# Patient Record
Sex: Male | Born: 1945
Health system: Southern US, Community
[De-identification: ages and names within clinical notes are randomized; demographics above are authoritative.]

## PROBLEM LIST (undated history)

## (undated) DIAGNOSIS — N189 Chronic kidney disease, unspecified: Secondary | ICD-10-CM

## (undated) DIAGNOSIS — I10 Essential (primary) hypertension: Secondary | ICD-10-CM

## (undated) DIAGNOSIS — N400 Enlarged prostate without lower urinary tract symptoms: Secondary | ICD-10-CM

## (undated) DIAGNOSIS — H409 Unspecified glaucoma: Secondary | ICD-10-CM

## (undated) DIAGNOSIS — C61 Malignant neoplasm of prostate: Secondary | ICD-10-CM

## (undated) DIAGNOSIS — E7849 Other hyperlipidemia: Secondary | ICD-10-CM

## (undated) DIAGNOSIS — K648 Other hemorrhoids: Secondary | ICD-10-CM

## (undated) HISTORY — PX: PROSTATE BIOPSY: SHX241

## (undated) HISTORY — DX: Unspecified glaucoma: H40.9

## (undated) HISTORY — DX: Essential (primary) hypertension: I10

## (undated) HISTORY — DX: Chronic kidney disease, unspecified: N18.9

## (undated) HISTORY — DX: Other hemorrhoids: K64.8

## (undated) HISTORY — DX: Other hyperlipidemia: E78.49

---
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
## 1999-06-04 ENCOUNTER — Ambulatory Visit (HOSPITAL_COMMUNITY): Admission: RE | Admit: 1999-06-04 | Discharge: 1999-06-04 | Payer: Self-pay | Admitting: *Deleted

## 2008-10-19 HISTORY — PX: EYE SURGERY: SHX253

## 2009-10-30 ENCOUNTER — Encounter (INDEPENDENT_AMBULATORY_CARE_PROVIDER_SITE_OTHER): Payer: Self-pay | Admitting: *Deleted

## 2009-10-31 ENCOUNTER — Ambulatory Visit: Payer: Self-pay | Admitting: Gastroenterology

## 2009-11-12 ENCOUNTER — Ambulatory Visit: Payer: Self-pay | Admitting: Gastroenterology

## 2010-11-20 NOTE — Miscellaneous (Signed)
Summary: SCREENING COLON--CH  Clinical Lists Changes  Medications: Added new medication of MOVIPREP 100 GM SOLR (PEG-KCL-NACL-NASULF-NA ASC-C) As directed - Signed Rx of MOVIPREP 100 GM SOLR (PEG-KCL-NACL-NASULF-NA ASC-C) As directed;  #1 x 0;  Signed;  Entered by: Clide Cliff RN;  Authorized by: Louis Meckel MD;  Method used: Electronically to [facility] [phone]*, [address], [place], [place]  [address], Ph: [phone], Fax: [phone] Observations: Added new observation of ALLERGY REV: Done (10/31/2009 15:55)    Prescriptions: MOVIPREP 100 GM SOLR (PEG-KCL-NACL-NASULF-NA ASC-C) As directed  #1 x 0   Entered by:   Clide Cliff RN   Authorized by:   Louis Meckel MD   Signed by:   Clide Cliff RN on 10/31/2009   Method used:   Electronically to        [facility] [phone]* (retail)       [address]       [place], [place]  [address]       Ph: [phone]       Fax: [phone]   RxID:   [id number]

## 2010-11-20 NOTE — Procedures (Signed)
Summary: Colonoscopy  Patient: Mitchael Luckey Note: All result statuses are Final unless otherwise noted.  Tests: (1) Colonoscopy (COL)   COL Colonoscopy           DONE     [facility]     [address]     [place], [place]  [address]           COLONOSCOPY PROCEDURE REPORT           PATIENT:  Binnie, Vonderhaar  MR#:  [id number]     BIRTHDATE:  05/26/1941, 68 yrs. old  GENDER:  male           ENDOSCOPIST:  Barbette Hair. Arlyce Dice, MD     Referred by:           PROCEDURE DATE:  11/12/2009     PROCEDURE:  Colonoscopy, Diagnostic     ASA CLASS:  Class I     INDICATIONS:  history of pre-cancerous (adenomatous) colon polyps                 MEDICATIONS:   Fentanyl 75 mcg IV, Versed 8 mg IV           DESCRIPTION OF PROCEDURE:   After the risks benefits and     alternatives of the procedure were thoroughly explained, informed     consent was obtained.  Digital rectal exam was performed and     revealed no abnormalities.   The LB CF-[id number] endoscope     was introduced through the anus and advanced to the cecum, which     was identified by both the appendix and ileocecal valve, without     limitations.  The quality of the prep was excellent, using     MoviPrep.  The instrument was then slowly withdrawn as the colon     was fully examined.     <<PROCEDUREIMAGES>>           FINDINGS:  Mild diverticulosis was found sigmoid to descending     Internal hemorrhoids were found (see image16).  This was otherwise     a normal examination of the colon (see image1, image3, image4,     image5, image6, image7, image11, image15, and image16).     Retroflexed views in the rectum revealed no abnormalities.    The     scope was then withdrawn from the patient and the procedure     completed.           COMPLICATIONS:  None           ENDOSCOPIC IMPRESSION:     1) Mild diverticulosis in the sigmoid to descending     2) Internal hemorrhoids     3) Otherwise normal examination  RECOMMENDATIONS:     1) colonoscopy in 7 years           REPEAT EXAM:  In 7 year(s) for Colonoscopy.           ______________________________     Barbette Hair. Arlyce Dice, MD           CC:  Juline Patch, MD           n.     Rosalie Doctor:   Barbette Hair. Kaplan at 11/12/2009 10:36 AM           Larose Hires, [id number]  Note: An exclamation mark (!) indicates a result that was not dispersed into the flowsheet. Document Creation Date: 11/12/2009 10:36 AM _______________________________________________________________________  (1) Order result status: Final  Collection or observation date-time: 11/12/2009 10:27 Requested date-time:  Receipt date-time:  Reported date-time:  Referring Physician:   Ordering Physician: Melvia Heaps [phone]) Specimen Source:  Source: Launa Grill Order Number: [id number] Lab site:   Appended Document: Colonoscopy    Clinical Lists Changes  Observations: Added new observation of COLONNXTDUE: 10/2016 (11/12/2009 13:04)

## 2010-11-20 NOTE — Letter (Signed)
Summary: [facility] Instructions  [facility]  [address] [place], [place] [address]   Phone: [phone]  Fax: [phone]       Kevin Gamble    1946/01/06    MRN: [id number]        Procedure Day Dorna Bloom:  Jake Shark  11/12/09     Arrival Time:  9:00AM     Procedure Time:  10:00AM     Location of Procedure:                    Juliann Pares _  [facility] (4th Floor)                        PREPARATION FOR COLONOSCOPY WITH MOVIPREP   Starting 5 days prior to your procedure 11/07/09 do not eat nuts, seeds, popcorn, corn, beans, peas,  salads, or any raw vegetables.  Do not take any fiber supplements (e.g. Metamucil, Citrucel, and Benefiber).  THE DAY BEFORE YOUR PROCEDURE         DATE: 11/11/09  DAY: MONDAY  1.  Drink clear liquids the entire day-NO SOLID FOOD  2.  Do not drink anything colored red or purple.  Avoid juices with pulp.  No orange juice.  3.  Drink at least 64 oz. (8 glasses) of fluid/clear liquids during the day to prevent dehydration and help the prep work efficiently.  CLEAR LIQUIDS INCLUDE: Water Jello Ice Popsicles Tea (sugar ok, no milk/cream) Powdered fruit flavored drinks Coffee (sugar ok, no milk/cream) Gatorade Juice: apple, white grape, white cranberry  Lemonade Clear bullion, consomm, broth Carbonated beverages (any kind) Strained chicken noodle soup Hard Candy                             4.  In the morning, mix first dose of MoviPrep solution:    Empty 1 Pouch A and 1 Pouch B into the disposable container    Add lukewarm drinking water to the top line of the container. Mix to dissolve    Refrigerate (mixed solution should be used within 24 hrs)  5.  Begin drinking the prep at 5:00 p.m. The MoviPrep container is divided by 4 marks.   Every 15 minutes drink the solution down to the next mark (approximately 8 oz) until the full liter is complete.   6.  Follow completed prep with 16 oz of clear liquid of your choice  (Nothing red or purple).  Continue to drink clear liquids until bedtime.  7.  Before going to bed, mix second dose of MoviPrep solution:    Empty 1 Pouch A and 1 Pouch B into the disposable container    Add lukewarm drinking water to the top line of the container. Mix to dissolve    Refrigerate  THE DAY OF YOUR PROCEDURE      DATE: 11/12/09 DAY: TUESDAY  Beginning at 5:00AM (5 hours before procedure):         1. Every 15 minutes, drink the solution down to the next mark (approx 8 oz) until the full liter is complete.  2. Follow completed prep with 16 oz. of clear liquid of your choice.    3. You may drink clear liquids until 8:00AM (2 HOURS BEFORE PROCEDURE).   MEDICATION INSTRUCTIONS  Unless otherwise instructed, you should take regular prescription medications with a small sip of water   as early as possible the morning of  your procedure.        OTHER INSTRUCTIONS  You will need a responsible adult at least 64 years of age to accompany you and drive you home.   This person must remain in the waiting room during your procedure.  Wear loose fitting clothing that is easily removed.  Leave jewelry and other valuables at home.  However, you may wish to bring a book to read or  an iPod/MP3 player to listen to music as you wait for your procedure to start.  Remove all body piercing jewelry and leave at home.  Total time from sign-in until discharge is approximately 2-3 hours.  You should go home directly after your procedure and rest.  You can resume normal activities the  day after your procedure.  The day of your procedure you should not:   Drive   Make legal decisions   Operate machinery   Drink alcohol   Return to work  You will receive specific instructions about eating, activities and medications before you leave.    The above instructions have been reviewed and explained to me by   Clide Cliff, RN______________________    I fully understand and  can verbalize these instructions _____________________________ Date _________

## 2013-07-05 DIAGNOSIS — Z23 Encounter for immunization: Secondary | ICD-10-CM | POA: Diagnosis not present

## 2013-07-27 DIAGNOSIS — Z0000 Encounter for general adult medical examination without abnormal findings: Secondary | ICD-10-CM | POA: Diagnosis not present

## 2013-09-07 DIAGNOSIS — R32 Unspecified urinary incontinence: Secondary | ICD-10-CM | POA: Diagnosis not present

## 2013-10-02 DIAGNOSIS — R972 Elevated prostate specific antigen [PSA]: Secondary | ICD-10-CM | POA: Diagnosis not present

## 2013-10-19 DIAGNOSIS — N400 Enlarged prostate without lower urinary tract symptoms: Secondary | ICD-10-CM

## 2013-10-19 HISTORY — DX: Benign prostatic hyperplasia without lower urinary tract symptoms: N40.0

## 2013-11-22 DIAGNOSIS — D075 Carcinoma in situ of prostate: Secondary | ICD-10-CM | POA: Diagnosis not present

## 2013-11-22 DIAGNOSIS — R972 Elevated prostate specific antigen [PSA]: Secondary | ICD-10-CM | POA: Diagnosis not present

## 2013-11-30 DIAGNOSIS — R972 Elevated prostate specific antigen [PSA]: Secondary | ICD-10-CM | POA: Diagnosis not present

## 2013-12-22 DIAGNOSIS — H25019 Cortical age-related cataract, unspecified eye: Secondary | ICD-10-CM | POA: Diagnosis not present

## 2013-12-22 DIAGNOSIS — H18419 Arcus senilis, unspecified eye: Secondary | ICD-10-CM | POA: Diagnosis not present

## 2013-12-22 DIAGNOSIS — H02839 Dermatochalasis of unspecified eye, unspecified eyelid: Secondary | ICD-10-CM | POA: Diagnosis not present

## 2013-12-22 DIAGNOSIS — H25049 Posterior subcapsular polar age-related cataract, unspecified eye: Secondary | ICD-10-CM | POA: Diagnosis not present

## 2013-12-22 DIAGNOSIS — H2510 Age-related nuclear cataract, unspecified eye: Secondary | ICD-10-CM | POA: Diagnosis not present

## 2014-01-24 DIAGNOSIS — Z125 Encounter for screening for malignant neoplasm of prostate: Secondary | ICD-10-CM | POA: Diagnosis not present

## 2014-01-24 DIAGNOSIS — Z0000 Encounter for general adult medical examination without abnormal findings: Secondary | ICD-10-CM | POA: Diagnosis not present

## 2014-01-24 DIAGNOSIS — E7800 Pure hypercholesterolemia, unspecified: Secondary | ICD-10-CM | POA: Diagnosis not present

## 2014-01-30 DIAGNOSIS — Z1331 Encounter for screening for depression: Secondary | ICD-10-CM | POA: Diagnosis not present

## 2014-01-30 DIAGNOSIS — I452 Bifascicular block: Secondary | ICD-10-CM | POA: Diagnosis not present

## 2014-01-30 DIAGNOSIS — N401 Enlarged prostate with lower urinary tract symptoms: Secondary | ICD-10-CM | POA: Diagnosis not present

## 2014-01-30 DIAGNOSIS — N138 Other obstructive and reflux uropathy: Secondary | ICD-10-CM | POA: Diagnosis not present

## 2014-01-30 DIAGNOSIS — Z0000 Encounter for general adult medical examination without abnormal findings: Secondary | ICD-10-CM | POA: Diagnosis not present

## 2014-02-12 DIAGNOSIS — H269 Unspecified cataract: Secondary | ICD-10-CM | POA: Diagnosis not present

## 2014-02-12 DIAGNOSIS — H2510 Age-related nuclear cataract, unspecified eye: Secondary | ICD-10-CM | POA: Diagnosis not present

## 2014-02-13 DIAGNOSIS — H2510 Age-related nuclear cataract, unspecified eye: Secondary | ICD-10-CM | POA: Diagnosis not present

## 2014-03-05 DIAGNOSIS — H269 Unspecified cataract: Secondary | ICD-10-CM | POA: Diagnosis not present

## 2014-03-05 DIAGNOSIS — H2510 Age-related nuclear cataract, unspecified eye: Secondary | ICD-10-CM | POA: Diagnosis not present

## 2014-05-30 DIAGNOSIS — R972 Elevated prostate specific antigen [PSA]: Secondary | ICD-10-CM | POA: Diagnosis not present

## 2014-06-06 DIAGNOSIS — R972 Elevated prostate specific antigen [PSA]: Secondary | ICD-10-CM | POA: Diagnosis not present

## 2014-06-19 DIAGNOSIS — N400 Enlarged prostate without lower urinary tract symptoms: Secondary | ICD-10-CM | POA: Diagnosis not present

## 2014-07-21 DIAGNOSIS — Z23 Encounter for immunization: Secondary | ICD-10-CM | POA: Diagnosis not present

## 2014-09-05 DIAGNOSIS — M62830 Muscle spasm of back: Secondary | ICD-10-CM | POA: Diagnosis not present

## 2014-09-12 DIAGNOSIS — M62830 Muscle spasm of back: Secondary | ICD-10-CM | POA: Diagnosis not present

## 2014-12-05 DIAGNOSIS — R972 Elevated prostate specific antigen [PSA]: Secondary | ICD-10-CM | POA: Diagnosis not present

## 2014-12-14 DIAGNOSIS — R972 Elevated prostate specific antigen [PSA]: Secondary | ICD-10-CM | POA: Diagnosis not present

## 2015-02-01 DIAGNOSIS — E780 Pure hypercholesterolemia: Secondary | ICD-10-CM | POA: Diagnosis not present

## 2015-02-01 DIAGNOSIS — Z125 Encounter for screening for malignant neoplasm of prostate: Secondary | ICD-10-CM | POA: Diagnosis not present

## 2015-02-01 DIAGNOSIS — Z0000 Encounter for general adult medical examination without abnormal findings: Secondary | ICD-10-CM | POA: Diagnosis not present

## 2015-02-07 DIAGNOSIS — E780 Pure hypercholesterolemia: Secondary | ICD-10-CM | POA: Diagnosis not present

## 2015-02-07 DIAGNOSIS — I452 Bifascicular block: Secondary | ICD-10-CM | POA: Diagnosis not present

## 2015-02-07 DIAGNOSIS — N401 Enlarged prostate with lower urinary tract symptoms: Secondary | ICD-10-CM | POA: Diagnosis not present

## 2015-02-07 DIAGNOSIS — Z23 Encounter for immunization: Secondary | ICD-10-CM | POA: Diagnosis not present

## 2015-02-18 DIAGNOSIS — H52223 Regular astigmatism, bilateral: Secondary | ICD-10-CM | POA: Diagnosis not present

## 2015-02-18 DIAGNOSIS — H1045 Other chronic allergic conjunctivitis: Secondary | ICD-10-CM | POA: Diagnosis not present

## 2015-02-18 DIAGNOSIS — H5203 Hypermetropia, bilateral: Secondary | ICD-10-CM | POA: Diagnosis not present

## 2015-02-18 DIAGNOSIS — H524 Presbyopia: Secondary | ICD-10-CM | POA: Diagnosis not present

## 2015-08-05 DIAGNOSIS — R972 Elevated prostate specific antigen [PSA]: Secondary | ICD-10-CM | POA: Diagnosis not present

## 2015-08-06 DIAGNOSIS — Z23 Encounter for immunization: Secondary | ICD-10-CM | POA: Diagnosis not present

## 2015-11-04 DIAGNOSIS — R972 Elevated prostate specific antigen [PSA]: Secondary | ICD-10-CM | POA: Diagnosis not present

## 2015-11-12 DIAGNOSIS — Z0000 Encounter for general adult medical examination without abnormal findings: Secondary | ICD-10-CM | POA: Diagnosis not present

## 2015-11-12 DIAGNOSIS — I861 Scrotal varices: Secondary | ICD-10-CM | POA: Diagnosis not present

## 2015-11-12 DIAGNOSIS — N3281 Overactive bladder: Secondary | ICD-10-CM | POA: Diagnosis not present

## 2015-11-12 DIAGNOSIS — N138 Other obstructive and reflux uropathy: Secondary | ICD-10-CM | POA: Diagnosis not present

## 2015-11-12 DIAGNOSIS — R972 Elevated prostate specific antigen [PSA]: Secondary | ICD-10-CM | POA: Diagnosis not present

## 2015-11-12 DIAGNOSIS — N401 Enlarged prostate with lower urinary tract symptoms: Secondary | ICD-10-CM | POA: Diagnosis not present

## 2016-01-21 DIAGNOSIS — H40013 Open angle with borderline findings, low risk, bilateral: Secondary | ICD-10-CM | POA: Diagnosis not present

## 2016-01-21 DIAGNOSIS — H353131 Nonexudative age-related macular degeneration, bilateral, early dry stage: Secondary | ICD-10-CM | POA: Diagnosis not present

## 2016-01-21 DIAGNOSIS — H26493 Other secondary cataract, bilateral: Secondary | ICD-10-CM | POA: Diagnosis not present

## 2016-01-30 DIAGNOSIS — H4720 Unspecified optic atrophy: Secondary | ICD-10-CM | POA: Diagnosis not present

## 2016-01-30 DIAGNOSIS — H353131 Nonexudative age-related macular degeneration, bilateral, early dry stage: Secondary | ICD-10-CM | POA: Diagnosis not present

## 2016-01-30 DIAGNOSIS — H40013 Open angle with borderline findings, low risk, bilateral: Secondary | ICD-10-CM | POA: Diagnosis not present

## 2016-02-04 DIAGNOSIS — Z0001 Encounter for general adult medical examination with abnormal findings: Secondary | ICD-10-CM | POA: Diagnosis not present

## 2016-02-04 DIAGNOSIS — E7800 Pure hypercholesterolemia, unspecified: Secondary | ICD-10-CM | POA: Diagnosis not present

## 2016-02-04 DIAGNOSIS — Z125 Encounter for screening for malignant neoplasm of prostate: Secondary | ICD-10-CM | POA: Diagnosis not present

## 2016-02-05 DIAGNOSIS — Z0000 Encounter for general adult medical examination without abnormal findings: Secondary | ICD-10-CM | POA: Diagnosis not present

## 2016-02-11 DIAGNOSIS — R972 Elevated prostate specific antigen [PSA]: Secondary | ICD-10-CM | POA: Diagnosis not present

## 2016-02-11 DIAGNOSIS — N401 Enlarged prostate with lower urinary tract symptoms: Secondary | ICD-10-CM | POA: Diagnosis not present

## 2016-02-11 DIAGNOSIS — N3281 Overactive bladder: Secondary | ICD-10-CM | POA: Diagnosis not present

## 2016-02-11 DIAGNOSIS — I452 Bifascicular block: Secondary | ICD-10-CM | POA: Diagnosis not present

## 2016-03-10 DIAGNOSIS — H019 Unspecified inflammation of eyelid: Secondary | ICD-10-CM | POA: Diagnosis not present

## 2016-03-26 DIAGNOSIS — L308 Other specified dermatitis: Secondary | ICD-10-CM | POA: Diagnosis not present

## 2016-08-01 DIAGNOSIS — Z23 Encounter for immunization: Secondary | ICD-10-CM | POA: Diagnosis not present

## 2016-08-10 DIAGNOSIS — R972 Elevated prostate specific antigen [PSA]: Secondary | ICD-10-CM | POA: Diagnosis not present

## 2016-08-17 DIAGNOSIS — N3281 Overactive bladder: Secondary | ICD-10-CM | POA: Diagnosis not present

## 2016-08-17 DIAGNOSIS — N400 Enlarged prostate without lower urinary tract symptoms: Secondary | ICD-10-CM | POA: Diagnosis not present

## 2016-08-17 DIAGNOSIS — R972 Elevated prostate specific antigen [PSA]: Secondary | ICD-10-CM | POA: Diagnosis not present

## 2016-09-09 ENCOUNTER — Encounter: Payer: Self-pay | Admitting: Gastroenterology

## 2016-11-20 DIAGNOSIS — J069 Acute upper respiratory infection, unspecified: Secondary | ICD-10-CM | POA: Diagnosis not present

## 2016-11-20 DIAGNOSIS — R05 Cough: Secondary | ICD-10-CM | POA: Diagnosis not present

## 2016-12-31 DIAGNOSIS — L250 Unspecified contact dermatitis due to cosmetics: Secondary | ICD-10-CM | POA: Diagnosis not present

## 2016-12-31 DIAGNOSIS — L818 Other specified disorders of pigmentation: Secondary | ICD-10-CM | POA: Diagnosis not present

## 2017-02-01 DIAGNOSIS — R972 Elevated prostate specific antigen [PSA]: Secondary | ICD-10-CM | POA: Diagnosis not present

## 2017-10-19 HISTORY — PX: COLONOSCOPY: SHX174

## 2019-03-17 ENCOUNTER — Telehealth: Payer: Self-pay | Admitting: Cardiology

## 2019-03-17 NOTE — Telephone Encounter (Signed)
LVM for patient to call back and let us know if he wants a video or phone visit.

## 2019-03-20 ENCOUNTER — Telehealth: Payer: Self-pay | Admitting: Cardiology

## 2019-03-20 NOTE — Telephone Encounter (Signed)
No  Mychart, no smartphone, consent (verbal), pre reg complete 03/20/19 AF

## 2019-03-22 ENCOUNTER — Ambulatory Visit: Payer: Medicare Other | Admitting: Cardiology

## 2019-03-23 ENCOUNTER — Ambulatory Visit (INDEPENDENT_AMBULATORY_CARE_PROVIDER_SITE_OTHER): Payer: Medicare Other | Admitting: Cardiology

## 2019-03-23 ENCOUNTER — Other Ambulatory Visit: Payer: Self-pay

## 2019-03-23 ENCOUNTER — Encounter: Payer: Self-pay | Admitting: Cardiology

## 2019-03-23 VITALS — BP 136/74 | HR 53 | Temp 98.2°F | Ht 67.5 in | Wt 144.0 lb

## 2019-03-23 DIAGNOSIS — I453 Trifascicular block: Secondary | ICD-10-CM

## 2019-03-23 DIAGNOSIS — E785 Hyperlipidemia, unspecified: Secondary | ICD-10-CM

## 2019-03-23 DIAGNOSIS — I10 Essential (primary) hypertension: Secondary | ICD-10-CM

## 2019-03-23 DIAGNOSIS — R001 Bradycardia, unspecified: Secondary | ICD-10-CM

## 2019-03-23 NOTE — Progress Notes (Signed)
PCP: Kevin Pretty, MD  Clinic Note: Chief Complaint  Patient presents with   New Patient (Initial Visit)    dizziness & fatigue   Abnormal ECG    Kevin Gamble, 1st Deg AVB, RBBB & LAFB     HPI: Kevin Gamble is a (Likely 62) officially (by recorded birthday) 73 y.o. male with a PMH below who is being seen today for evaluation of Abnormal EKG/Bradycardia (Trifascicular Block with Kevin Gamble - 49) at the request of Kevin Pretty, MD.  Kevin Gamble was last seen on May 27 by Kevin Gamble for routine evaluation.  The patient was noticing some lightheadedness on standing but no real presyncope.  No palpitations.  Maybe little fatigue.  He was noted to have a heart rate recorded of 60 bpm on vital signs, but an EKG showed heart rate of 49 bpm with first-degree AV block (PR interval 252) as well as right bundle branch block and left anterior fascicular block.  (This was called bifascicular block, but is actually trifascicular block.  As such, he was referred to cardiology for establishing care as he is at high risk for potentially requiring pacemaker placement. --> The patient was having symptoms concerning for orthostatic hypotension and blood pressures on the patient's log showing numbers ranging down into the 67H and 41P systolic on his ARB-HCTZ combination. -->  Kevin Gamble 300 mg - 12.5 mg was discontinued and he was started on Kevin Gamble 25 mg  Recent Hospitalizations: None  Studies Personally Reviewed - (if available, images/films reviewed: From Epic Chart or Care Everywhere)  PCP EKG reviewed (see below)  Interval History: Kevin Gamble is here today stating that he just recently switched his blood pressure medication but does think that some the lightheadedness and dizziness has improved.  He did note and showed me his blood pressure recordings that he was getting some Gamble low blood pressure readings and was getting a lot of frequent lightheadedness and dizziness with standing but is not  necessarily as bad now. He is able to easily Jog-walk 2- 2-1/2 mile per day in ~38-40 min (a few years ago she was able do this a little faster, so he has slowed down some).  Despite having slowed down a lot, he denies any chest pain or shortness of breath with rest or exertion.  He really does not describe fatigue, just that is not able to go as fast as he was.  But no sense of delaying getting going with walking.    Now, he is noticing more intermittent dizziness with certain head movements and with standing up quickly but but not anything to suggest syncope or near syncope.  No TIA or amaurosis fugax.  No PND, orthopnea or edema.  No palpitations, lightheadedness, dizziness, weakness or syncope/near syncope. No TIA/amaurosis fugax symptoms. No melena, hematochezia, hematuria, or epstaxis. No claudication.  ROS: A comprehensive was performed. Review of Systems  Constitutional: Negative for chills, fever and malaise/fatigue.  HENT: Negative for congestion and nosebleeds.   Respiratory: Negative for cough and shortness of breath.   Gastrointestinal: Negative for abdominal pain, blood in stool, heartburn, melena, nausea and vomiting.  Genitourinary: Positive for frequency (prostate). Negative for dysuria and hematuria.  Musculoskeletal: Positive for joint pain (mild).  Neurological: Positive for dizziness (mild positional dizziness - less noticealbe with BP med change). Negative for tingling, focal weakness, weakness and headaches.  Endo/Heme/Allergies: Does not bruise/bleed easily.  Psychiatric/Behavioral: Negative for memory loss. The patient is not nervous/anxious and does not have insomnia (occasionally --  frequent urination/nocturia).   All other systems reviewed and are negative.   The patient does not have symptoms concerning for COVID-19 infection (fever, chills, cough, or new shortness of breath).  The patient is practicing social distancing.   COVID-19 Education: The signs and  symptoms of COVID-19 were discussed with the patient and how to seek care for testing (follow up with PCP or arrange E-visit).   The importance of social distancing was discussed today.   I have reviewed and (if needed) personally updated the patient's problem list, medications, allergies, past medical and surgical history, social and family history.   Past Medical History:  Diagnosis Date   BPH (benign prostatic hyperplasia) 2015   With increased urinary frequency --> Dr. Pilar Jarvis (Urology) - elevated PSA - Bx Neg    Essential hypertension    Glaucoma    Dr. Katy Fitch   Hyperlipidemia due to dietary fat intake    Managed with diet control   Internal hemorrhoids     Past Surgical History:  Procedure Laterality Date   PROSTATE BIOPSY  2015, 2016   Dr. Vance Gather    Current Meds  Medication Sig   dutasteride (AVODART) 0.5 MG capsule Take 0.5 mg by mouth daily.   Kevin Gamble (COZAAR) 25 MG tablet Take 25 mg by mouth daily.   silodosin (RAPAFLO) 8 MG CAPS capsule Take 8 mg by mouth daily.   solifenacin (VESICARE) 5 MG tablet Take 5 mg by mouth daily.    No Known Allergies  Social History   Tobacco Use   Smoking status: Never Smoker   Smokeless tobacco: Never Used  Substance Use Topics   Alcohol use: Never    Frequency: Never   Drug use: Never   Social History   Social History Narrative   Is a married father of 4 sons.    His listed birthday is in [birth year], but he thinks that actually his birthday was probably in [birth year] instead.   He usually walks every day about 2 to 1/61miles at a relatively brisk pace.    family history includes Healthy in his sister; Hypertension in his brother; Other in his father and mother.  Wt Readings from Last 3 Encounters:  03/23/19 144 lb (65.3 kg)    PHYSICAL EXAM BP 136/74 (BP Location: Left Arm, Patient Position: Sitting, Cuff Size: Normal)    Pulse (!) 53    Temp 98.2 F (36.8 C)    Ht 5' 7.5" (1.715 m)    Wt 144 lb (65.3 kg)    BMI  22.22 kg/m  Physical Exam  Constitutional: He is oriented to person, place, and time. He appears well-developed and well-nourished. No distress.  Actually appears younger than stated age.  Well-groomed  HENT:  Head: Normocephalic and atraumatic.  Wearing facemask  Eyes: Pupils are equal, round, and reactive to light. EOM are normal. No scleral icterus.  Neck: Normal range of motion. Neck supple. No hepatojugular reflux and no JVD present. Carotid bruit is not present.  Cardiovascular: Regular rhythm, S1 normal and normal pulses.  No extrasystoles are present. Bradycardia present. PMI is not displaced. Exam reveals no gallop (Split S2) and no friction rub.  No murmur (Cannot exclude soft SEM at RUSB.) heard. Pulmonary/Chest: Effort normal and breath sounds normal. No respiratory distress. He has no wheezes. He has no rales.  Abdominal: Soft. Bowel sounds are normal. He exhibits no distension. There is no abdominal tenderness. There is no rebound.  Musculoskeletal: Normal range of motion.  General: No deformity or edema.  Neurological: He is alert and oriented to person, place, and time. No cranial nerve deficit.  Skin: Skin is warm and dry. No rash noted. No erythema.  Psychiatric: He has a normal mood and affect. His behavior is normal. Judgment and thought content normal.  Vitals reviewed.    Adult ECG Report Reviewed from PCPs office 03/15/2019: Rate: 49;  Rhythm: sinus bradycardia and First-degree AV block.  LAFB (-79), RBBB,;   Narrative Interpretation: Abnormal EKG.   Rate: 53;  Rhythm: sinus bradycardia and 1 degree AVB, RBBB and LAFB (axis -70);   Narrative Interpretation: Relatively stable compared to PCP.   Other studies Reviewed: Additional studies/ records that were reviewed today include:  Recent Labs:  Mar 10, 2019:   Na+ 145, K+ 4.5, Cl- 102, HCO3- 22, BUN 16, Cr 1.34, Glu 86, Ca2+ 10.2; AST 22, ALT 15, AlkP 78  CBC: W 5.4, H/H 14.2/42.9, Plt 157  TC 205, TG  62, HDL 70, LDL 123.  ASSESSMENT / PLAN: Problem List Items Addressed This Visit    Essential hypertension (Chronic)    His blood pressure looks Gamble good today.  His pressures are certainly higher since having converted to Kevin Gamble and is noticing less dizziness.  Hard to tell if the dizziness is because of hypotension or bradycardia.  Plan: Checking ZIO Patch monitor. Continue Kevin Gamble and avoid any AV nodal agents including beta-blockers, non-dihydropyridine calcium channel blockers (diltiazem or verapamil)      Relevant Medications   Kevin Gamble (COZAAR) 25 MG tablet   Hyperlipidemia LDL goal <100 (Chronic)    Given his age, being male with hypertension, would probably shoot for little more close lipid control.  LDL currently 123 on no medications.  Depending on what his card evaluation shows, if there is potential concern for possible CAD involved, would recommend more aggressive management and likely adding statin.      Relevant Medications   Kevin Gamble (COZAAR) 25 MG tablet   Sinus bradycardia by electrocardiogram    I suspect that his bradycardia is probably related to him being quite conditioned and helping with frequent walking.  However with the presence of trifascicular block I am a little worried about him potentially having some high-grade AV block and pauses.  Plan: 14-day ZIO patch monitor      Relevant Orders   EKG 12-Lead (Completed)   ECHOCARDIOGRAM COMPLETE   LONG TERM MONITOR (3-14 DAYS)   Trifascicular bundle branch block: 1deg AVG, RBBB, LAFB - Primary (Chronic)    At this point relatively asymptomatic with no sensation to suggest pauses or necessarily chronotropic incompetence.  However there is a Gamble significant amount of abnormality and therefore would like to exclude any structural abnormalities and regional wall motion normalities which would then lead to ischemic evaluation.  Also need to assess his heart rate responsiveness, and potential pauses/dangerous  bradycardia.  Plan: 14-day ZIO patch monitor and 2D echocardiogram.      Relevant Medications   Kevin Gamble (COZAAR) 25 MG tablet   Other Relevant Orders   EKG 12-Lead (Completed)   ECHOCARDIOGRAM COMPLETE      I spent a total of 25 minutes with the patient and additional 10-15 chart review. >  50% of the total time was spent in direct patient consultation.   Current medicines are reviewed at length with the patient today.  (+/- concerns) none The following changes have been made:  none  Patient Instructions  Medication Instructions:  NO CHANGE. If you need a  refill on your cardiac medications before your next appointment, please call your pharmacy.   Lab work: None  Testing/Procedures:  ECHOCARDIOGRAM.   Event Monitor ZIO -14 DAYS.   Follow-Up:   You will need a follow up appointment in   3 MONTHS AFTER TEST  .  Please call our office 2 months in advance to schedule this appointment.  You may see Glenetta Hew, MD or one of the following Advanced Practice Providers on your designated Care Team:    Rosaria Ferries, PA-C  Jory Sims, DNP, ANP  Any Other Special Instructions Will Be Listed Below (If Applicable).    Studies Ordered:   Orders Placed This Encounter  Procedures   LONG TERM MONITOR (3-14 DAYS)   EKG 12-Lead   ECHOCARDIOGRAM COMPLETE      Glenetta Hew, M.D., M.S. Interventional Cardiologist   Pager # [phone] Phone # [phone] [address] [place], [place] [address]   Thank you for choosing Heartcare at [facility]!!

## 2019-03-23 NOTE — Patient Instructions (Addendum)
Medication Instructions:  NO CHANGE. If you need a refill on your cardiac medications before your next appointment, please call your pharmacy.   Lab work:  If you have labs (blood work) drawn today and your tests are completely normal, you will receive your results only by: [name] MyChart Message (if you have MyChart) OR . A paper copy in the mail If you have any lab test that is abnormal or we need to change your treatment, we will call you to review the results.  Testing/Procedures: WILL SCHEDULE AT [place] Your physician has requested that you have an echocardiogram. Echocardiography is a painless test that uses sound waves to create images of your heart. It provides your doctor with information about the size and shape of your heart and how well your heart's chambers and valves are working. This procedure takes approximately one hour. There are no restrictions for this procedure.  AND Your physician has recommended that you wear an event monitor ZIO -14 DAYS. Event monitors are medical devices that record the heart's electrical activity. Doctors most often [place] these monitors to diagnose arrhythmias. Arrhythmias are problems with the speed or rhythm of the heartbeat. The monitor is a small, portable device. You can wear one while you do your normal daily activities. This is usually used to diagnose what is causing palpitations/syncope (passing out).    Follow-Up: At [facility], you and your health needs are our priority.  As part of our continuing mission to provide you with exceptional heart care, we have created designated Provider Care Teams.  These Care Teams include your primary Cardiologist (physician) and Advanced Practice Providers (APPs -  Physician Assistants and Nurse Practitioners) who all work together to provide you with the care you need, when you need it. . You will need a follow up appointment in   3 MONTHS AFTER TEST  .  Please call our office 2  months in advance to schedule this appointment.  You may see Glenetta Hew, MD or one of the following Advanced Practice Providers on your designated Care Team:   . Rosaria Ferries, PA-C . Jory Sims, DNP, ANP  Any Other Special Instructions Will Be Listed Below (If Applicable).

## 2019-03-24 ENCOUNTER — Encounter: Payer: Self-pay | Admitting: Cardiology

## 2019-03-24 DIAGNOSIS — E785 Hyperlipidemia, unspecified: Secondary | ICD-10-CM | POA: Insufficient documentation

## 2019-03-24 DIAGNOSIS — I10 Essential (primary) hypertension: Secondary | ICD-10-CM | POA: Insufficient documentation

## 2019-03-24 NOTE — Assessment & Plan Note (Signed)
Given his age, being male with hypertension, would probably shoot for little more close lipid control.  LDL currently 123 on no medications.  Depending on what his card evaluation shows, if there is potential concern for possible CAD involved, would recommend more aggressive management and likely adding statin.

## 2019-03-24 NOTE — Assessment & Plan Note (Signed)
At this point relatively asymptomatic with no sensation to suggest pauses or necessarily chronotropic incompetence.  However there is a pretty significant amount of abnormality and therefore would like to exclude any structural abnormalities and regional wall motion normalities which would then lead to ischemic evaluation.  Also need to assess his heart rate responsiveness, and potential pauses/dangerous bradycardia.  Plan: 14-day ZIO patch monitor and 2D echocardiogram.

## 2019-03-24 NOTE — Assessment & Plan Note (Signed)
I suspect that his bradycardia is probably related to him being quite conditioned and helping with frequent walking.  However with the presence of trifascicular block I am a little worried about him potentially having some high-grade AV block and pauses.  Plan: 14-day ZIO patch monitor

## 2019-03-24 NOTE — Assessment & Plan Note (Signed)
His blood pressure looks pretty good today.  His pressures are certainly higher since having converted to losartan and is noticing less dizziness.  Hard to tell if the dizziness is because of hypotension or bradycardia.  Plan: Checking ZIO Patch monitor. Continue losartan and avoid any AV nodal agents including beta-blockers, non-dihydropyridine calcium channel blockers (diltiazem or verapamil)

## 2019-03-29 ENCOUNTER — Telehealth: Payer: Self-pay | Admitting: *Deleted

## 2019-03-29 NOTE — Telephone Encounter (Signed)
14 day ZIO XT long term holter monitor will be mailed to your home.  You will not be able to remove your monitor for your Echocardiogram, so schedule accordingly.  Instructions reviewed briefly as they are included in the monitor kit.

## 2019-04-02 ENCOUNTER — Ambulatory Visit (INDEPENDENT_AMBULATORY_CARE_PROVIDER_SITE_OTHER): Payer: Medicare Other

## 2019-04-02 DIAGNOSIS — I440 Atrioventricular block, first degree: Secondary | ICD-10-CM | POA: Diagnosis not present

## 2019-04-02 DIAGNOSIS — I453 Trifascicular block: Secondary | ICD-10-CM | POA: Diagnosis not present

## 2019-04-02 DIAGNOSIS — R001 Bradycardia, unspecified: Secondary | ICD-10-CM | POA: Diagnosis not present

## 2019-04-14 ENCOUNTER — Telehealth (HOSPITAL_COMMUNITY): Payer: Self-pay | Admitting: *Deleted

## 2019-04-14 NOTE — Telephone Encounter (Signed)
COVID-19 Pre-Screening Questions:  . Do you currently have a fever? NO (yes = cancel and refer to pcp for e-visit) . Have you recently travelled on a cruise, internationally, or to [place], [place], [place], [place], [place], or [place], [place] ([place]) ? NO (yes = cancel, stay home, monitor symptoms, and contact pcp or initiate e-visit if symptoms develop) . Have you been in contact with someone that is currently pending confirmation of Covid19 testing or has been confirmed to have the Covid19 virus?  NO (yes = cancel, stay home, away from tested individual, monitor symptoms, and contact pcp or initiate e-visit if symptoms develop) . Are you currently experiencing fatigue or cough? NO (yes = pt should be prepared to have a mask placed at the time of their visit).   . Reiterated no additional visitors. . Arrive no earlier than 15 minutes before appointment time. . Please bring own mask.  Kevin Gamble 

## 2019-04-17 ENCOUNTER — Ambulatory Visit (HOSPITAL_COMMUNITY): Payer: Medicare Other | Attending: Cardiology

## 2019-04-17 ENCOUNTER — Other Ambulatory Visit: Payer: Self-pay

## 2019-04-17 DIAGNOSIS — R001 Bradycardia, unspecified: Secondary | ICD-10-CM | POA: Diagnosis not present

## 2019-04-17 DIAGNOSIS — I453 Trifascicular block: Secondary | ICD-10-CM | POA: Insufficient documentation

## 2019-04-18 ENCOUNTER — Telehealth: Payer: Self-pay | Admitting: *Deleted

## 2019-04-18 NOTE — Telephone Encounter (Signed)
The patient has been notified of the result and verbalized understanding.  All questions (if any) were answered. Raiford Simmonds, RN 04/18/2019 12:13 PM  Apt. Schedule for 07/24/19 at 10:20 am . Patient aware.

## 2019-04-18 NOTE — Telephone Encounter (Signed)
-----   Message from Leonie Man, MD sent at 04/17/2019  5:08 PM EDT ----- Overall pretty normal echocardiogram.  Ejection fraction is normal (60 to 65%)   Normal LV systolic function; mild diastolic dysfunction; mild LVH; mildly dilated ascending aorta; mild AI, MR and TR.  Nothing here to explain reason for right bundle branch block. This would suggest the right bundle branch block is benign  Glenetta Hew, MD

## 2019-04-18 NOTE — Telephone Encounter (Signed)
Left message to cal back - need to discuss eco results - also schedule f/u appt w/ dr Ellyn Hack for late sept or oct 2020

## 2019-04-19 ENCOUNTER — Telehealth: Payer: Self-pay | Admitting: *Deleted

## 2019-04-19 NOTE — Telephone Encounter (Signed)
Spoke with patient regarding appointment with Dr. Ellyn Hack on 07/24/19 (proivder is out of the office)---needed to reschedule to Wednesday 07/26/19 at 10:20am---patient voiced his understanding

## 2019-04-25 ENCOUNTER — Other Ambulatory Visit: Payer: Self-pay | Admitting: Cardiology

## 2019-04-25 ENCOUNTER — Other Ambulatory Visit: Payer: Self-pay

## 2019-04-25 DIAGNOSIS — I440 Atrioventricular block, first degree: Secondary | ICD-10-CM

## 2019-04-25 DIAGNOSIS — I453 Trifascicular block: Secondary | ICD-10-CM

## 2019-04-25 DIAGNOSIS — R001 Bradycardia, unspecified: Secondary | ICD-10-CM

## 2019-07-24 ENCOUNTER — Ambulatory Visit: Payer: Medicare Other | Admitting: Cardiology

## 2019-07-26 ENCOUNTER — Ambulatory Visit: Payer: Medicare Other | Admitting: Cardiology

## 2019-07-26 ENCOUNTER — Other Ambulatory Visit: Payer: Self-pay

## 2019-07-26 VITALS — BP 151/83 | HR 57 | Ht 67.5 in | Wt 137.0 lb

## 2019-07-26 DIAGNOSIS — I453 Trifascicular block: Secondary | ICD-10-CM | POA: Diagnosis not present

## 2019-07-26 DIAGNOSIS — I10 Essential (primary) hypertension: Secondary | ICD-10-CM

## 2019-07-26 DIAGNOSIS — R001 Bradycardia, unspecified: Secondary | ICD-10-CM

## 2019-07-26 NOTE — Patient Instructions (Addendum)
Medication Instructions:  NO CHANGES   Lab work:  NOT NEEDED.  Testing/Procedures: NOT NEEDED Follow-Up: At [facility], you and your health needs are our priority.  As part of our continuing mission to provide you with exceptional heart care, we have created designated Provider Care Teams.  These Care Teams include your primary Cardiologist (physician) and Advanced Practice Providers (APPs -  Physician Assistants and Nurse Practitioners) who all work together to provide you with the care you need, when you need it. You will need a follow up appointment ON AS NEEDED   Any Other Special Instructions Will Be Listed Below (If Applicable).

## 2019-07-26 NOTE — Progress Notes (Signed)
PCP: Kevin Pretty, MD  Clinic Note: Chief Complaint  Patient presents with   Follow-up    Test results.  No symptoms    HPI: Kevin Gamble is a (Likely 62) officially (by recorded birthday) 73 y.o. male presenting for follow-up evaluation of abnormal EKG-bradycardia (trifascicular block) originally seen at the request of Kevin Pretty, MD.  Kevin Gamble was seen in consultation back in June.  He was Gamble much asymptomatic and feeling much better after his medications were adjusted with no more orthostatic hypotension on lower dose of losartan.  There was concern about his EKG showing trifascicular block so we evaluated him in an event monitor and echocardiogram.  Recent Hospitalizations: None  Studies Personally Reviewed - (if available, images/films reviewed: From Epic Chart or Care Everywhere)  Echo April 17, 2019- normal LV function.  EF 60 to 65%.  Mild LVH.  GR 1 DD.  Normal valves with only mild AI, MR & TR.  Event monitor: predominant underlying rhythm sinus rhythm with first-degree AV block (along with bundle branch block)  Min HR 41 bpm, Max HR 143 bpm, Avg HR 58 bpm.  No prolonged pauses noted.  15 relatively short bursts of PAT/SVT ranging from 5-15 beats --> not counted on diary   Relatively rare PACs and PVCs noted. No couplets, triplets or bigeminy/trigeminy noted.  Interval History: Kevin Gamble is here today again doing quite well.  Feeling fine.  No more dizziness or lightheadedness.  He still doing his jogging and walking for about 40 minutes a day.  Some days he goes faster than others but doing fine.  He denies any fatigue or exercise intolerance.  His blood pressure little high today, but in light of him having had some orthostatic dizziness before.  Probably okay.  No longer having the dizziness spells when standing etc.  No chest pain or pressure with rest or exertion.  No dyspnea on exertion.  No PND, orthopnea or edema.  No syncope/near syncope or  TIA/amaurosis fugax.  No claudication.  ROS: Pertinent symptoms noted in HPI.  Otherwise doing fine  He continues to deny any concerning symptoms for COVID-19 and is still doing his social distancing.  Wearing his mask etc.  I have reviewed and (if needed) personally updated the patient's problem list, medications, allergies, past medical and surgical history, social and family history.   Past Medical History:  Diagnosis Date   BPH (benign prostatic hyperplasia) 2015   With increased urinary frequency --> Dr. Pilar Jarvis (Urology) - elevated PSA - Bx Neg    Essential hypertension    Glaucoma    Dr. Katy Fitch   Hyperlipidemia due to dietary fat intake    Managed with diet control   Internal hemorrhoids     Past Surgical History:  Procedure Laterality Date   PROSTATE BIOPSY  2015, 2016   Dr. Vance Gather    Current Meds  Medication Sig   dutasteride (AVODART) 0.5 MG capsule Take 0.5 mg by mouth daily.   losartan (COZAAR) 25 MG tablet Take 25 mg by mouth daily.   silodosin (RAPAFLO) 8 MG CAPS capsule Take 8 mg by mouth daily.   solifenacin (VESICARE) 5 MG tablet Take 5 mg by mouth daily.    No Known Allergies  Social History   Tobacco Use   Smoking status: Never Smoker   Smokeless tobacco: Never Used  Substance Use Topics   Alcohol use: Never    Frequency: Never   Drug use: Never   Social History  Social History Narrative   Is a married father of 4 sons.    His listed birthday is in [birth year], but he thinks that actually his birthday was probably in [birth year] instead.   He usually walks every day about 2 to 1/39miles at a relatively brisk pace.    family history includes Healthy in his sister; Hypertension in his brother; Other in his father and mother.  Wt Readings from Last 3 Encounters:  07/26/19 137 lb (62.1 kg)  03/23/19 144 lb (65.3 kg)    PHYSICAL EXAM BP (!) 151/83    Pulse (!) 57    Ht 5' 7.5" (1.715 m)    Wt 137 lb (62.1 kg)    SpO2 98%    BMI 21.14 kg/m  @  home SBP in 120-130 mmHg range. Physical Exam  Constitutional: He is oriented to person, place, and time. He appears well-developed and well-nourished. No distress.  Actually appears younger than stated age.  Well-groomed  HENT:  Head: Normocephalic and atraumatic.  Wearing facemask  Neck: No hepatojugular reflux and no JVD present. Carotid bruit is not present.  Cardiovascular: Regular rhythm, S1 normal and normal pulses.  No extrasystoles are present. Bradycardia present. PMI is not displaced. Exam reveals no gallop (Split S2) and no friction rub.  No murmur (Cannot exclude soft SEM at RUSB.) heard. Pulmonary/Chest: Effort normal and breath sounds normal. No respiratory distress. He has no wheezes. He has no rales.  Musculoskeletal: Normal range of motion.        General: No edema.  Neurological: He is alert and oriented to person, place, and time.  Psychiatric: He has a normal mood and affect. His behavior is normal. Judgment and thought content normal.  Vitals reviewed.    Adult ECG Report Reviewed from PCPs office 03/15/2019: Not checked.   Other studies Reviewed: Additional studies/ records that were reviewed today include:   Recent Labs: No new labs  ASSESSMENT / PLAN: Problem List Items Addressed This Visit    Trifascicular bundle branch block: 1deg AVG, RBBB, LAFB - Primary (Chronic)   Essential hypertension (Chronic)   Sinus bradycardia by electrocardiogram     Relatively asymptomatic trifascicular block.  Monitor did not show any signs of any pauses.  He is asymptomatic with no exercise intolerance.  No indication for pacemaker, especially since he is able to get his heart rate into the 140s.  Monitor did show some brief runs of PAT, however would not use an AV nodal agent given his bradycardia and trifascicular block to avoid progression of block. Echocardiogram shows no structural abnormalities.  At this point I think we will simply monitor him from a symptomatic  standpoint.  He is very active and not having any anginal symptoms so I doubt there is any active ischemic component.  Blood pressure is little high but in light of him having some orthostatic symptoms, will defer to PCP for managing this.  I think he can follow-up on an as-needed basis.  If there were signs or symptoms of syncope or near syncope, he would probably need to be referred for possible pacemaker evaluation, but for now this could be years before anything happens.   COVID-19 Education: The signs and symptoms of COVID-19 were discussed with the patient and how to seek care for testing (follow up with PCP or arrange E-visit).   The importance of social distancing was discussed today.   I spent a total of 71minutes with the patient. >  50% of the total time  was spent in direct patient consultation.   Current medicines are reviewed at length with the patient today.  (+/- concerns) none  Patient Instructions  Medication Instructions:  NO CHANGES   Lab work:  NOT NEEDED.  Testing/Procedures: NOT NEEDED Follow-Up: At [facility], you and your health needs are our priority.  As part of our continuing mission to provide you with exceptional heart care, we have created designated Provider Care Teams.  These Care Teams include your primary Cardiologist (physician) and Advanced Practice Providers (APPs -  Physician Assistants and Nurse Practitioners) who all work together to provide you with the care you need, when you need it. You will need a follow up appointment ON AS NEEDED   Any Other Special Instructions Will Be Listed Below (If Applicable).     Studies Ordered:   No orders of the defined types were placed in this encounter.     Glenetta Hew, M.D., M.S. Interventional Cardiologist   Pager # [phone] Phone # [phone] [address] [place], [place] [address]   Thank you for choosing Heartcare at [facility]!!

## 2019-07-28 ENCOUNTER — Encounter: Payer: Self-pay | Admitting: Cardiology

## 2019-11-07 ENCOUNTER — Ambulatory Visit: Payer: Medicare PPO | Attending: Internal Medicine

## 2019-11-07 DIAGNOSIS — Z23 Encounter for immunization: Secondary | ICD-10-CM | POA: Insufficient documentation

## 2019-11-07 NOTE — Progress Notes (Signed)
   Covid-19 Vaccination Clinic  Name:  Kevin Gamble    MRN: [id number] DOB: 04-26-1946  11/07/2019  [name] was observed post Covid-19 immunization for 15 minutes without incidence. He was provided with Vaccine Information Sheet and instruction to access the V-Safe system.   [name] was instructed to call 911 with any severe reactions post vaccine: [name] Difficulty breathing  . Swelling of your face and throat  . A fast heartbeat  . A bad rash all over your body  . Dizziness and weakness    Immunizations Administered    Name Date Dose VIS Date Route   Pfizer COVID-19 Vaccine 11/07/2019  1:40 PM 0.3 mL 09/29/2019 Intramuscular   Manufacturer: [place]   Lot: [id number]   [place]: [id number]

## 2019-11-27 ENCOUNTER — Ambulatory Visit: Payer: Medicare PPO | Attending: Internal Medicine

## 2019-11-27 DIAGNOSIS — Z23 Encounter for immunization: Secondary | ICD-10-CM | POA: Insufficient documentation

## 2019-11-27 NOTE — Progress Notes (Signed)
   Covid-19 Vaccination Clinic  Name:  Kevin Gamble    MRN: [id number] DOB: 1946-06-04  11/27/2019  [name] was observed post Covid-19 immunization for 15 minutes without incidence. He was provided with Vaccine Information Sheet and instruction to access the V-Safe system.   [name] was instructed to call 911 with any severe reactions post vaccine: [name] Difficulty breathing  . Swelling of your face and throat  . A fast heartbeat  . A bad rash all over your body  . Dizziness and weakness    Immunizations Administered    Name Date Dose VIS Date Route   Pfizer COVID-19 Vaccine 11/27/2019  1:50 PM 0.3 mL 09/29/2019 Intramuscular   Manufacturer: [place]   Lot: [id number]   [place]: [id number]

## 2020-03-13 DIAGNOSIS — I10 Essential (primary) hypertension: Secondary | ICD-10-CM | POA: Diagnosis not present

## 2020-03-13 DIAGNOSIS — Z125 Encounter for screening for malignant neoplasm of prostate: Secondary | ICD-10-CM | POA: Diagnosis not present

## 2020-03-20 DIAGNOSIS — N401 Enlarged prostate with lower urinary tract symptoms: Secondary | ICD-10-CM | POA: Diagnosis not present

## 2020-03-20 DIAGNOSIS — I10 Essential (primary) hypertension: Secondary | ICD-10-CM | POA: Diagnosis not present

## 2020-03-20 DIAGNOSIS — R972 Elevated prostate specific antigen [PSA]: Secondary | ICD-10-CM | POA: Diagnosis not present

## 2020-03-20 DIAGNOSIS — N3281 Overactive bladder: Secondary | ICD-10-CM | POA: Diagnosis not present

## 2020-03-20 DIAGNOSIS — Z0001 Encounter for general adult medical examination with abnormal findings: Secondary | ICD-10-CM | POA: Diagnosis not present

## 2020-03-20 DIAGNOSIS — N183 Chronic kidney disease, stage 3 unspecified: Secondary | ICD-10-CM | POA: Diagnosis not present

## 2020-04-03 DIAGNOSIS — R972 Elevated prostate specific antigen [PSA]: Secondary | ICD-10-CM | POA: Diagnosis not present

## 2020-04-11 DIAGNOSIS — R972 Elevated prostate specific antigen [PSA]: Secondary | ICD-10-CM | POA: Diagnosis not present

## 2020-04-11 DIAGNOSIS — N5201 Erectile dysfunction due to arterial insufficiency: Secondary | ICD-10-CM | POA: Diagnosis not present

## 2020-04-11 DIAGNOSIS — R3915 Urgency of urination: Secondary | ICD-10-CM | POA: Diagnosis not present

## 2020-04-12 ENCOUNTER — Other Ambulatory Visit: Payer: Self-pay | Admitting: Urology

## 2020-04-12 DIAGNOSIS — R972 Elevated prostate specific antigen [PSA]: Secondary | ICD-10-CM

## 2020-04-23 DIAGNOSIS — H3589 Other specified retinal disorders: Secondary | ICD-10-CM | POA: Diagnosis not present

## 2020-04-23 DIAGNOSIS — H524 Presbyopia: Secondary | ICD-10-CM | POA: Diagnosis not present

## 2020-04-23 DIAGNOSIS — H52223 Regular astigmatism, bilateral: Secondary | ICD-10-CM | POA: Diagnosis not present

## 2020-04-23 DIAGNOSIS — H5203 Hypermetropia, bilateral: Secondary | ICD-10-CM | POA: Diagnosis not present

## 2020-04-23 DIAGNOSIS — Z961 Presence of intraocular lens: Secondary | ICD-10-CM | POA: Diagnosis not present

## 2020-04-23 DIAGNOSIS — H3540 Unspecified peripheral retinal degeneration: Secondary | ICD-10-CM | POA: Diagnosis not present

## 2020-04-23 DIAGNOSIS — Z9849 Cataract extraction status, unspecified eye: Secondary | ICD-10-CM | POA: Diagnosis not present

## 2020-05-02 ENCOUNTER — Other Ambulatory Visit: Payer: Self-pay

## 2020-05-02 ENCOUNTER — Encounter: Payer: Self-pay | Admitting: Gastroenterology

## 2020-05-02 ENCOUNTER — Ambulatory Visit (AMBULATORY_SURGERY_CENTER): Payer: Self-pay | Admitting: *Deleted

## 2020-05-02 VITALS — Ht 67.5 in | Wt 131.0 lb

## 2020-05-02 DIAGNOSIS — Z1211 Encounter for screening for malignant neoplasm of colon: Secondary | ICD-10-CM

## 2020-05-02 MED ORDER — SUTAB 1479-225-188 MG PO TABS: 1.0000 | ORAL_TABLET | Freq: Once | ORAL | 0 refills | Status: AC

## 2020-05-02 NOTE — Progress Notes (Signed)

## 2020-05-11 ENCOUNTER — Ambulatory Visit
Admission: RE | Admit: 2020-05-11 | Discharge: 2020-05-11 | Disposition: A | Discharge status: Home / Self Care | Payer: Medicare PPO | Source: Ambulatory Visit | Attending: Urology | Admitting: Urology

## 2020-05-11 DIAGNOSIS — R590 Localized enlarged lymph nodes: Secondary | ICD-10-CM | POA: Diagnosis not present

## 2020-05-11 DIAGNOSIS — R972 Elevated prostate specific antigen [PSA]: Secondary | ICD-10-CM

## 2020-05-11 DIAGNOSIS — N4289 Other specified disorders of prostate: Secondary | ICD-10-CM | POA: Diagnosis not present

## 2020-05-11 DIAGNOSIS — N3289 Other specified disorders of bladder: Secondary | ICD-10-CM | POA: Diagnosis not present

## 2020-05-11 MED ORDER — GADOBENATE DIMEGLUMINE 529 MG/ML IV SOLN
12.0000 mL | Freq: Once | INTRAVENOUS | Status: AC | PRN
  Administered 2020-05-11: 12 mL via INTRAVENOUS

## 2020-05-14 DIAGNOSIS — R3915 Urgency of urination: Secondary | ICD-10-CM | POA: Diagnosis not present

## 2020-05-14 DIAGNOSIS — R972 Elevated prostate specific antigen [PSA]: Secondary | ICD-10-CM | POA: Diagnosis not present

## 2020-05-14 DIAGNOSIS — N5201 Erectile dysfunction due to arterial insufficiency: Secondary | ICD-10-CM | POA: Diagnosis not present

## 2020-05-16 ENCOUNTER — Encounter: Payer: Self-pay | Admitting: Gastroenterology

## 2020-05-16 ENCOUNTER — Other Ambulatory Visit: Payer: Self-pay

## 2020-05-16 ENCOUNTER — Ambulatory Visit (AMBULATORY_SURGERY_CENTER): Payer: Medicare PPO | Admitting: Gastroenterology

## 2020-05-16 VITALS — BP 140/74 | HR 55 | Temp 95.3°F | Resp 13 | Ht 67.5 in | Wt 131.0 lb

## 2020-05-16 DIAGNOSIS — D123 Benign neoplasm of transverse colon: Secondary | ICD-10-CM

## 2020-05-16 DIAGNOSIS — Z1211 Encounter for screening for malignant neoplasm of colon: Secondary | ICD-10-CM

## 2020-05-16 DIAGNOSIS — D124 Benign neoplasm of descending colon: Secondary | ICD-10-CM

## 2020-05-16 MED ORDER — SODIUM CHLORIDE 0.9 % IV SOLN: 500.0000 mL | Freq: Once | INTRAVENOUS | Status: DC

## 2020-05-16 NOTE — Patient Instructions (Signed)
HANDOUTS PROVIDED ON: POLYPS & HEMORRHOIDS ° °The polyps removed today have been sent for pathology.  The results can take 1-3 weeks to receive.  When your next colonoscopy should occur will be based on the pathology results.   ° °You may resume your previous diet and medication schedule. ° °Thank you for allowing us to care for you today!!! ° ° °YOU HAD AN ENDOSCOPIC PROCEDURE TODAY AT THE [facility]:   Refer to the procedure report that was given to you for any specific questions about what was found during the examination.  If the procedure report does not answer your questions, please call your gastroenterologist to clarify.  If you requested that your care partner not be given the details of your procedure findings, then the procedure report has been included in a sealed envelope for you to review at your convenience later. ° °YOU SHOULD EXPECT: Some feelings of bloating in the abdomen. Passage of more gas than usual.  Walking can help get rid of the air that was put into your GI tract during the procedure and reduce the bloating. If you had a lower endoscopy (such as a colonoscopy or flexible sigmoidoscopy) you may notice spotting of blood in your stool or on the toilet paper. If you underwent a bowel prep for your procedure, you may not have a normal bowel movement for a few days. ° °Please Note:  You might notice some irritation and congestion in your nose or some drainage.  This is from the oxygen used during your procedure.  There is no need for concern and it should clear up in a day or so. ° °SYMPTOMS TO REPORT IMMEDIATELY: ° °· Following lower endoscopy (colonoscopy or flexible sigmoidoscopy): ° Excessive amounts of blood in the stool ° Significant tenderness or worsening of abdominal pains ° Swelling of the abdomen that is new, acute ° Fever of 100°F or higher ° °For urgent or emergent issues, a gastroenterologist can be reached at any hour by calling [phone]. °Do not use MyChart  messaging for urgent concerns.  ° ° °DIET:  We do recommend a small meal at first, but then you may proceed to your regular diet.  Drink plenty of fluids but you should avoid alcoholic beverages for 24 hours. ° °ACTIVITY:  You should plan to take it easy for the rest of today and you should NOT DRIVE or use heavy machinery until tomorrow (because of the sedation medicines used during the test).   ° °FOLLOW UP: °Our staff will call the number listed on your records 48-72 hours following your procedure to check on you and address any questions or concerns that you may have regarding the information given to you following your procedure. If we do not reach you, we will leave a message.  We will attempt to reach you two times.  During this call, we will ask if you have developed any symptoms of COVID 19. If you develop any symptoms (ie: fever, flu-like symptoms, shortness of breath, cough etc.) before then, please call [phone].  If you test positive for Covid 19 in the 2 weeks post procedure, please call and report this information to us.   ° °If any biopsies were taken you will be contacted by phone or by letter within the next 1-3 weeks.  Please call us at [phone] if you have not heard about the biopsies in 3 weeks.  ° ° °SIGNATURES/CONFIDENTIALITY: °You and/or your care partner have signed paperwork which will be   entered into your electronic medical record.  These signatures attest to the fact that that the information above on your After Visit Summary has been reviewed and is understood.  Full responsibility of the confidentiality of this discharge information lies with you and/or your care-partner. 

## 2020-05-16 NOTE — Progress Notes (Signed)
To PACU, VSS. Report to [email or web address] 

## 2020-05-16 NOTE — Progress Notes (Signed)
Called to room to assist during endoscopic procedure.  Patient ID and intended procedure confirmed with present staff. Received instructions for my participation in the procedure from the performing physician.  

## 2020-05-16 NOTE — Progress Notes (Signed)
Pt's states no medical or surgical changes since previsit or office visit. 

## 2020-05-16 NOTE — Op Note (Signed)
[place] Patient Name: Kevin Gamble Procedure Date: 05/16/2020 7:19 AM MRN: [id number] Endoscopist: Remo Lipps P. Havery Moros , MD Age: 74 Referring MD:  Date of Birth: [birth year] Gender: Male Account #: [id number] Procedure:                Colonoscopy Indications:              Screening for colorectal malignant neoplasm Medicines:                Monitored Anesthesia Care Procedure:                Pre-Anesthesia Assessment:                           - Prior to the procedure, a History and Physical                            was performed, and patient medications and                            allergies were reviewed. The patient's tolerance of                            previous anesthesia was also reviewed. The risks                            and benefits of the procedure and the sedation                            options and risks were discussed with the patient.                            All questions were answered, and informed consent                            was obtained. Prior Anticoagulants: The patient has                            taken no previous anticoagulant or antiplatelet                            agents. ASA Grade Assessment: II - A patient with                            mild systemic disease. After reviewing the risks                            and benefits, the patient was deemed in                            satisfactory condition to undergo the procedure.                           After obtaining informed consent, the colonoscope  was passed under direct vision. Throughout the                            procedure, the patient's blood pressure, pulse, and                            oxygen saturations were monitored continuously. The                            Colonoscope was introduced through the anus and                            advanced to the the cecum, identified by                            appendiceal orifice  and ileocecal valve. The                            colonoscopy was performed without difficulty. The                            patient tolerated the procedure well. The quality                            of the bowel preparation was good. The ileocecal                            valve, appendiceal orifice, and rectum were                            photographed. Scope In: 8:03:34 AM Scope Out: 8:25:38 AM Scope Withdrawal Time: 0 hours 18 minutes 0 seconds  Total Procedure Duration: 0 hours 22 minutes 4 seconds  Findings:                 The perianal and digital rectal examinations were                            normal.                           Two sessile polyps were found in the transverse                            colon. The polyps were 3 to 4 mm in size. These                            polyps were removed with a cold snare. Resection                            and retrieval were complete.                           A 4 to 5 mm polyp was found in the descending  colon. The polyp was sessile. The polyp was removed                            with a cold snare. Resection and retrieval were                            complete.                           Internal hemorrhoids were found during retroflexion.                           The exam was otherwise without abnormality. Complications:            No immediate complications. Estimated blood loss:                            Minimal. Estimated Blood Loss:     Estimated blood loss was minimal. Impression:               - Two 3 to 4 mm polyps in the transverse colon,                            removed with a cold snare. Resected and retrieved.                           - One 4 to 5 mm polyp in the descending colon,                            removed with a cold snare. Resected and retrieved.                           - Internal hemorrhoids.                           - The examination was otherwise  normal. Recommendation:           - Patient has a contact number available for                            emergencies. The signs and symptoms of potential                            delayed complications were discussed with the                            patient. Return to normal activities tomorrow.                            Written discharge instructions were provided to the                            patient.                           - Resume previous diet.                           -  Continue present medications.                           - Await pathology results. Remo Lipps P. Havery Moros, MD 05/16/2020 8:28:45 AM This report has been signed electronically.

## 2020-05-20 ENCOUNTER — Telehealth: Payer: Self-pay

## 2020-05-20 NOTE — Telephone Encounter (Signed)
°  Follow up Call-  Call back number 05/16/2020  Post procedure Call Back phone  # [phone]  Permission to leave phone message Yes  Some recent data might be hidden     Patient questions:  Do you have a fever, pain , or abdominal swelling? No. Pain Score  0 *  Have you tolerated food without any problems? Yes.    Have you been able to return to your normal activities? Yes.    Do you have any questions about your discharge instructions: Diet   No. Medications  No. Follow up visit  No.  Do you have questions or concerns about your Care? No.  Actions: * If pain score is 4 or above: 1. No action needed, pain <4.Have you developed a fever since your procedure? no  2.   Have you had an respiratory symptoms (SOB or cough) since your procedure? no  3.   Have you tested positive for COVID 19 since your procedure no  4.   Have you had any family members/close contacts diagnosed with the COVID 19 since your procedure?  no   If yes to any of these questions please route to Joylene John, RN and Erenest Rasher, RN

## 2020-05-30 DIAGNOSIS — R972 Elevated prostate specific antigen [PSA]: Secondary | ICD-10-CM | POA: Diagnosis not present

## 2020-05-30 DIAGNOSIS — C61 Malignant neoplasm of prostate: Secondary | ICD-10-CM | POA: Diagnosis not present

## 2020-05-30 DIAGNOSIS — D075 Carcinoma in situ of prostate: Secondary | ICD-10-CM | POA: Diagnosis not present

## 2020-06-13 DIAGNOSIS — R3915 Urgency of urination: Secondary | ICD-10-CM | POA: Diagnosis not present

## 2020-06-13 DIAGNOSIS — C61 Malignant neoplasm of prostate: Secondary | ICD-10-CM | POA: Diagnosis not present

## 2020-06-13 DIAGNOSIS — N5201 Erectile dysfunction due to arterial insufficiency: Secondary | ICD-10-CM | POA: Diagnosis not present

## 2020-06-26 ENCOUNTER — Other Ambulatory Visit (HOSPITAL_COMMUNITY): Payer: Self-pay | Admitting: Urology

## 2020-06-26 DIAGNOSIS — C61 Malignant neoplasm of prostate: Secondary | ICD-10-CM

## 2020-07-03 ENCOUNTER — Encounter (HOSPITAL_COMMUNITY)
Admission: RE | Admit: 2020-07-03 | Discharge: 2020-07-03 | Disposition: A | Discharge status: Home / Self Care | Payer: Medicare PPO | Source: Ambulatory Visit | Attending: Urology | Admitting: Urology

## 2020-07-03 ENCOUNTER — Other Ambulatory Visit: Payer: Self-pay

## 2020-07-03 DIAGNOSIS — M160 Bilateral primary osteoarthritis of hip: Secondary | ICD-10-CM | POA: Diagnosis not present

## 2020-07-03 DIAGNOSIS — Z8546 Personal history of malignant neoplasm of prostate: Secondary | ICD-10-CM | POA: Diagnosis not present

## 2020-07-03 DIAGNOSIS — C61 Malignant neoplasm of prostate: Secondary | ICD-10-CM | POA: Insufficient documentation

## 2020-07-03 DIAGNOSIS — K7689 Other specified diseases of liver: Secondary | ICD-10-CM | POA: Diagnosis not present

## 2020-07-03 DIAGNOSIS — I700 Atherosclerosis of aorta: Secondary | ICD-10-CM | POA: Diagnosis not present

## 2020-07-03 DIAGNOSIS — N281 Cyst of kidney, acquired: Secondary | ICD-10-CM | POA: Diagnosis not present

## 2020-07-03 MED ORDER — TECHNETIUM TC 99M MEDRONATE IV KIT
20.2000 | PACK | Freq: Once | INTRAVENOUS | Status: AC | PRN
  Administered 2020-07-03: 20.2 via INTRAVENOUS

## 2020-07-26 DIAGNOSIS — C61 Malignant neoplasm of prostate: Secondary | ICD-10-CM | POA: Diagnosis not present

## 2020-08-06 DIAGNOSIS — C7951 Secondary malignant neoplasm of bone: Secondary | ICD-10-CM | POA: Diagnosis not present

## 2020-08-06 DIAGNOSIS — N5201 Erectile dysfunction due to arterial insufficiency: Secondary | ICD-10-CM | POA: Diagnosis not present

## 2020-08-06 DIAGNOSIS — R3915 Urgency of urination: Secondary | ICD-10-CM | POA: Diagnosis not present

## 2020-08-06 DIAGNOSIS — C61 Malignant neoplasm of prostate: Secondary | ICD-10-CM | POA: Diagnosis not present

## 2020-08-12 ENCOUNTER — Other Ambulatory Visit: Payer: Self-pay | Admitting: Urology

## 2020-08-12 DIAGNOSIS — C61 Malignant neoplasm of prostate: Secondary | ICD-10-CM

## 2020-08-12 DIAGNOSIS — C7951 Secondary malignant neoplasm of bone: Secondary | ICD-10-CM

## 2020-08-16 ENCOUNTER — Encounter (HOSPITAL_COMMUNITY): Payer: Self-pay

## 2020-08-16 ENCOUNTER — Encounter (HOSPITAL_COMMUNITY): Admission: RE | Admit: 2020-08-16 | Payer: Medicare PPO | Source: Ambulatory Visit

## 2020-08-17 ENCOUNTER — Ambulatory Visit (HOSPITAL_COMMUNITY)
Admission: RE | Admit: 2020-08-17 | Discharge: 2020-08-17 | Disposition: A | Discharge status: Home / Self Care | Payer: Medicare PPO | Source: Ambulatory Visit | Attending: Urology | Admitting: Urology

## 2020-08-17 DIAGNOSIS — C7951 Secondary malignant neoplasm of bone: Secondary | ICD-10-CM | POA: Diagnosis not present

## 2020-08-17 DIAGNOSIS — C61 Malignant neoplasm of prostate: Secondary | ICD-10-CM | POA: Diagnosis not present

## 2020-08-17 DIAGNOSIS — R918 Other nonspecific abnormal finding of lung field: Secondary | ICD-10-CM | POA: Diagnosis not present

## 2020-08-17 DIAGNOSIS — R948 Abnormal results of function studies of other organs and systems: Secondary | ICD-10-CM | POA: Diagnosis not present

## 2020-08-17 MED ORDER — GADOBUTROL 1 MMOL/ML IV SOLN
6.0000 mL | Freq: Once | INTRAVENOUS | Status: AC | PRN
  Administered 2020-08-17: 6 mL via INTRAVENOUS

## 2020-08-20 DIAGNOSIS — Z5111 Encounter for antineoplastic chemotherapy: Secondary | ICD-10-CM | POA: Diagnosis not present

## 2020-08-20 DIAGNOSIS — C61 Malignant neoplasm of prostate: Secondary | ICD-10-CM | POA: Diagnosis not present

## 2020-09-26 DIAGNOSIS — C61 Malignant neoplasm of prostate: Secondary | ICD-10-CM | POA: Diagnosis not present

## 2020-10-01 DIAGNOSIS — R3915 Urgency of urination: Secondary | ICD-10-CM | POA: Diagnosis not present

## 2020-10-01 DIAGNOSIS — C61 Malignant neoplasm of prostate: Secondary | ICD-10-CM | POA: Diagnosis not present

## 2020-10-01 DIAGNOSIS — N5201 Erectile dysfunction due to arterial insufficiency: Secondary | ICD-10-CM | POA: Diagnosis not present

## 2020-10-17 ENCOUNTER — Other Ambulatory Visit (HOSPITAL_COMMUNITY): Payer: Self-pay | Admitting: Urology

## 2020-10-17 DIAGNOSIS — C61 Malignant neoplasm of prostate: Secondary | ICD-10-CM

## 2020-10-29 ENCOUNTER — Other Ambulatory Visit: Payer: Self-pay

## 2020-10-29 ENCOUNTER — Ambulatory Visit (HOSPITAL_COMMUNITY)
Admission: RE | Admit: 2020-10-29 | Discharge: 2020-10-29 | Disposition: A | Discharge status: Home / Self Care | Payer: Medicare PPO | Source: Ambulatory Visit | Attending: Urology | Admitting: Urology

## 2020-10-29 DIAGNOSIS — C61 Malignant neoplasm of prostate: Secondary | ICD-10-CM | POA: Insufficient documentation

## 2020-10-29 DIAGNOSIS — R918 Other nonspecific abnormal finding of lung field: Secondary | ICD-10-CM | POA: Diagnosis not present

## 2020-10-29 MED ORDER — PIFLIFOLASTAT F 18 (PYLARIFY) INJECTION
9.0000 | Freq: Once | INTRAVENOUS | Status: AC
  Administered 2020-10-29: 9.4 via INTRAVENOUS

## 2020-11-12 DIAGNOSIS — C61 Malignant neoplasm of prostate: Secondary | ICD-10-CM | POA: Diagnosis not present

## 2020-11-20 DIAGNOSIS — C61 Malignant neoplasm of prostate: Secondary | ICD-10-CM | POA: Diagnosis not present

## 2020-11-20 DIAGNOSIS — R3915 Urgency of urination: Secondary | ICD-10-CM | POA: Diagnosis not present

## 2020-12-19 DIAGNOSIS — R3915 Urgency of urination: Secondary | ICD-10-CM | POA: Diagnosis not present

## 2020-12-19 DIAGNOSIS — C61 Malignant neoplasm of prostate: Secondary | ICD-10-CM | POA: Diagnosis not present

## 2020-12-23 ENCOUNTER — Encounter: Payer: Self-pay | Admitting: Radiation Oncology

## 2020-12-23 DIAGNOSIS — N401 Enlarged prostate with lower urinary tract symptoms: Secondary | ICD-10-CM | POA: Insufficient documentation

## 2020-12-23 DIAGNOSIS — N3281 Overactive bladder: Secondary | ICD-10-CM | POA: Insufficient documentation

## 2020-12-23 DIAGNOSIS — Z1211 Encounter for screening for malignant neoplasm of colon: Secondary | ICD-10-CM | POA: Insufficient documentation

## 2020-12-23 DIAGNOSIS — N183 Chronic kidney disease, stage 3 unspecified: Secondary | ICD-10-CM | POA: Insufficient documentation

## 2020-12-23 DIAGNOSIS — Z7982 Long term (current) use of aspirin: Secondary | ICD-10-CM | POA: Insufficient documentation

## 2020-12-23 DIAGNOSIS — N4231 Prostatic intraepithelial neoplasia: Secondary | ICD-10-CM | POA: Insufficient documentation

## 2020-12-23 DIAGNOSIS — J069 Acute upper respiratory infection, unspecified: Secondary | ICD-10-CM | POA: Insufficient documentation

## 2020-12-23 DIAGNOSIS — R972 Elevated prostate specific antigen [PSA]: Secondary | ICD-10-CM | POA: Insufficient documentation

## 2020-12-23 DIAGNOSIS — R292 Abnormal reflex: Secondary | ICD-10-CM | POA: Insufficient documentation

## 2020-12-23 DIAGNOSIS — I440 Atrioventricular block, first degree: Secondary | ICD-10-CM | POA: Insufficient documentation

## 2020-12-23 DIAGNOSIS — I452 Bifascicular block: Secondary | ICD-10-CM | POA: Insufficient documentation

## 2020-12-23 DIAGNOSIS — I861 Scrotal varices: Secondary | ICD-10-CM | POA: Insufficient documentation

## 2020-12-23 NOTE — Progress Notes (Signed)
GU Location of Tumor / Histology: prostatic adenocarcinoma  If Prostate Cancer, Gleason Score is (4 + 4) and PSA is (11 on avodart). Prostate volume: 141 mL  Patient had a negative prostate biopsy in 2016.    Biopsies of prostate (if applicable) revealed:   Past/Anticipated interventions by urology, if any: prostate biopsy, CT abd/pelvis, bone scan, prescribed vesicare, rapaflo, dutasteride, referred to Dr. Tammi Klippel to discuss radiation options  Past/Anticipated interventions by medical oncology, if any: no  Weight changes, if any: denies  Bowel/Bladder complaints, if any: IPSS 18. SHIM 15. Denies dysuria or hematuria. Reports occasional post void dribble. Reports urinary frequency, urgency and an intermittent stream. Reports nocturia x3. Denies any bowel complaints  Nausea/Vomiting, if any: no  Pain issues, if any:  denies  SAFETY ISSUES:  Prior radiation? denies  Pacemaker/ICD? denies  Possible current pregnancy? no, male patient  Is the patient on methotrexate? denies  Current Complaints / other details: 75 year old male. Retired [employer] professor. Married with four sons. Resides in [place].

## 2020-12-24 ENCOUNTER — Ambulatory Visit
Admission: RE | Admit: 2020-12-24 | Discharge: 2020-12-24 | Disposition: A | Discharge status: Home / Self Care | Payer: Medicare PPO | Source: Ambulatory Visit | Attending: Radiation Oncology | Admitting: Radiation Oncology

## 2020-12-24 ENCOUNTER — Encounter: Payer: Self-pay | Admitting: Radiation Oncology

## 2020-12-24 ENCOUNTER — Other Ambulatory Visit: Payer: Self-pay

## 2020-12-24 VITALS — BP 152/77 | HR 70 | Temp 96.8°F | Resp 18 | Ht 67.5 in | Wt 138.0 lb

## 2020-12-24 DIAGNOSIS — N400 Enlarged prostate without lower urinary tract symptoms: Secondary | ICD-10-CM | POA: Insufficient documentation

## 2020-12-24 DIAGNOSIS — C61 Malignant neoplasm of prostate: Secondary | ICD-10-CM | POA: Insufficient documentation

## 2020-12-24 DIAGNOSIS — E785 Hyperlipidemia, unspecified: Secondary | ICD-10-CM | POA: Diagnosis not present

## 2020-12-24 DIAGNOSIS — K649 Unspecified hemorrhoids: Secondary | ICD-10-CM | POA: Insufficient documentation

## 2020-12-24 DIAGNOSIS — N183 Chronic kidney disease, stage 3 unspecified: Secondary | ICD-10-CM | POA: Diagnosis not present

## 2020-12-24 DIAGNOSIS — Z923 Personal history of irradiation: Secondary | ICD-10-CM | POA: Diagnosis not present

## 2020-12-24 DIAGNOSIS — Z79899 Other long term (current) drug therapy: Secondary | ICD-10-CM | POA: Diagnosis not present

## 2020-12-24 DIAGNOSIS — I129 Hypertensive chronic kidney disease with stage 1 through stage 4 chronic kidney disease, or unspecified chronic kidney disease: Secondary | ICD-10-CM | POA: Diagnosis not present

## 2020-12-24 HISTORY — DX: Malignant neoplasm of prostate: C61

## 2020-12-24 NOTE — Progress Notes (Signed)
Radiation Oncology         [phone] ________________________________  Initial outpatient Consultation  Name: Kevin Gamble MRN: [id number]  Date: 12/24/2020  DOB: October 05, 1946  XF:[id number], Thayer Jew, MD  Alexis Frock, MD   REFERRING PHYSICIAN: Alexis Frock, MD  DIAGNOSIS: 75 y.o. gentleman with Stage T1c adenocarcinoma of the prostate with Gleason score of 4+4, and PSA of 22 (adjusted for finasteride) at diagnosis.    ICD-10-CM   1. Malignant neoplasm of prostate ([place])  C61     HISTORY OF PRESENT ILLNESS: Kevin Gamble is a 75 y.o. male with a diagnosis of prostate cancer. He has a longstanding history of BPH with elevated PSA and has been followed by [facility], initially with Dr. Janice Norrie, since at least 2003. He underwent prostate biopsies in 10/2001, 11/2013, and 11/2014, all of which were negative for any high-grade carcinoma. More recently, he has continued under the care of Dr. Tresa Moore and DRE has remained benign.  His PSA had remained relatively stable over the years, around 10.5 (adjusted for finasteride) until it was found to be further elevated at 14.4 (adjusted for finasteride) in April 2019.  The PSA continued to gradually rise, up to 22 (adjusted for finasteride) in 03/2020. This prompted a prostate MRI on 05/11/2020 showing a 10 mm PI-RADS 4 lesion in the left posterolateral mid peripheral zone with suspected mild gross extracapsular extension.  Additionally, there was a 17 mm PI-RADS 5 lesion in the left posterior mid peripheral zone with early microscopic extension possible but no definite gross EPE.  There was no evidence of seminal vesicle invasion, suspicious lymphadenopathy, or osseous metastases.  The prostate volume was estimated at 87.1 mL. The patient proceeded to MRI fusion biopsy of the prostate on 05/30/2020.  The prostate volume measured 141 cc by ultrasound.  Out of 16 core biopsies, 4 were positive.  The maximum Gleason score was 4+4, and this was seen in the left  base. Additionally, Gleason 4+3 was seen in the left base lateral and right base, and Gleason 3+3 in the right base lateral (small focus).  All samples from both ROI MRI lesions were benign, with one sample showing HG/PIN.  He underwent staging CT and bone scan on 07/03/2020. CT A/P was negative for adenopathy or osseous metastatic disease.  The bone scan showed suspicious uptake in the bilateral ribs, as well questionable uptake in sternum.  He does report a history of severe injury to the chest/ribs as a child.  A follow up chest MRI was performed on 08/17/2020 showing no findings for sternal or thoracic vertebral body metastatic disease and the speckled areas of sclerotic change in lower bilateral ribs remained indeterminate. He was started on ADT with a 42-month Eligard injection [place] in 08/2020.  His PSA decreased to 5.15 (she is10.4 adjusted for finasteride) in 09/2020. He underwent PSMA scan on 10/29/2020 to further evaluate for any possible metastases and this showed only mild activity within prostate gland without evidence of metastatic adenopathy or skeletal metastasis. His most recent PSA from 11/13/2020 had decreased further to 0.99 (2.0 adjusted for finasteride) a.  The patient reviewed the biopsy results with his urologist and he has kindly been referred today for discussion of potential radiation treatment options.   PREVIOUS RADIATION THERAPY: No  PAST MEDICAL HISTORY:  Past Medical History:  Diagnosis Date  . BPH (benign prostatic hyperplasia) 2015   With increased urinary frequency --> Dr. Pilar Jarvis (Urology) - elevated PSA - Bx Neg   . Chronic kidney  disease    stage 3 kidney disease   . Essential hypertension   . Glaucoma    Dr. Katy Fitch  . Hyperlipidemia due to dietary fat intake    Managed with diet control  . Internal hemorrhoids   . Prostate cancer ([place])       PAST SURGICAL HISTORY: Past Surgical History:  Procedure Laterality Date  . PROSTATE BIOPSY  2015, 2016   Dr.  Vance Gather    FAMILY HISTORY:  Family History  Problem Relation Age of Onset  . Other Mother        Unknonwn cause/history - died when pt was young  . Other Father        Unknonwn cause/history - died when pt was young  . Healthy Sister   . Hypertension Brother   . Colon cancer Neg Hx   . Esophageal cancer Neg Hx   . Rectal cancer Neg Hx   . Stomach cancer Neg Hx     SOCIAL HISTORY:  Social History   Socioeconomic History  . Marital status: Married    Spouse name: Not on file  . Number of children: 4  . Years of education: Not on file  . Highest education level: Not on file  Occupational History  . Occupation: professor    Fish farm manager: [employer]    Comment: retired  Tobacco Use  . Smoking status: Never Smoker  . Smokeless tobacco: Never Used  Vaping Use  . Vaping Use: Never used  Substance and Sexual Activity  . Alcohol use: Never  . Drug use: Never  . Sexual activity: Not Currently  Other Topics Concern  . Not on file  Social History Narrative   Is a married father of 4 sons.    His listed birthday is in [birth year], but he thinks that actually his birthday was probably in [birth year] instead.   He usually walks every day about 2 to 1/46miles at a relatively brisk pace.   Social Determinants of Health   Financial Resource Strain: Not on file  Food Insecurity: Not on file  Transportation Needs: Not on file  Physical Activity: Not on file  Stress: Not on file  Social Connections: Not on file  Intimate Partner Violence: Not on file    ALLERGIES: Patient has no known allergies.  MEDICATIONS:  Current Outpatient Medications  Medication Sig Dispense Refill  . bicalutamide (CASODEX) 50 MG tablet     . dutasteride (AVODART) 0.5 MG capsule 1 CAPSULE ONCE A DAY ORALLY 90 DAYS    . losartan (COZAAR) 25 MG tablet Take 25 mg by mouth daily.    . Multiple Vitamin (MULTIVITAMINS PO)     . silodosin (RAPAFLO) 8 MG CAPS capsule Take 8 mg by mouth daily.    . solifenacin (VESICARE)  5 MG tablet Take 5 mg by mouth daily.     No current facility-administered medications for this encounter.    REVIEW OF SYSTEMS:  On review of systems, the patient reports that he is doing well overall. He denies any chest pain, shortness of breath, cough, fevers, chills, night sweats, unintended weight changes. He denies any bowel disturbances, and denies abdominal pain, nausea or vomiting. He denies any new musculoskeletal or joint aches or pains. His IPSS was 18, indicating moderate urinary symptoms.He reports occasional post-void dribble, urinary frequency, urgency, intermittent stream, and nocturia x3. His SHIM was 15, indicating he has moderate erectile dysfunction. A complete review of systems is obtained and is otherwise negative.    PHYSICAL  EXAM:  Wt Readings from Last 3 Encounters:  12/24/20 138 lb (62.6 kg)  05/16/20 131 lb (59.4 kg)  05/02/20 131 lb (59.4 kg)   Temp Readings from Last 3 Encounters:  12/24/20 (!) 96.8 F (36 C) (Temporal)  05/16/20 (!) 95.3 F (35.2 C)  03/23/19 98.2 F (36.8 C)   BP Readings from Last 3 Encounters:  12/24/20 (!) 152/77  05/16/20 (!) 140/74  07/26/19 (!) 151/83   Pulse Readings from Last 3 Encounters:  12/24/20 70  05/16/20 55  07/26/19 (!) 57   Pain Assessment Pain Score: 0-No pain/10  In general this is a well appearing [place] male in no acute distress. He's alert and oriented x4 and appropriate throughout the examination. Cardiopulmonary assessment is negative for acute distress, and he exhibits normal effort.     KPS = 100  100 - Normal; no complaints; no evidence of disease. 90   - Able to carry on normal activity; minor signs or symptoms of disease. 80   - Normal activity with effort; some signs or symptoms of disease. 80   - Cares for self; unable to carry on normal activity or to do active work. 60   - Requires occasional assistance, but is able to care for most of his personal needs. 50   - Requires considerable  assistance and frequent medical care. 28   - Disabled; requires special care and assistance. 64   - Severely disabled; hospital admission is indicated although death not imminent. 1   - Very sick; hospital admission necessary; active supportive treatment necessary. 10   - Moribund; fatal processes progressing rapidly. 0     - Dead  Karnofsky DA, Abelmann WH, Craver LS and Burchenal JH [phone]) The use of the nitrogen mustards in the palliative treatment of carcinoma: with particular reference to bronchogenic carcinoma Cancer 1 [phone]  LABORATORY DATA:  No results found for: WBC, HGB, HCT, MCV, PLT No results found for: NA, K, CL, CO2 No results found for: ALT, AST, GGT, ALKPHOS, BILITOT   RADIOGRAPHY: No results found.    IMPRESSION/PLAN: 1. 75 y.o. gentleman with Stage T1c adenocarcinoma of the prostate with Gleason Score of 4+4, and PSA of 11 at diagnosis. We discussed the patient's workup and outlined the nature of prostate cancer in this setting. The patient's T stage, Gleason's score, and PSA put him into the high risk group. Accordingly, he is eligible for a variety of potential treatment options including prostatectomy or LT-ADT in combination with either 8 weeks of external radiation or 5 weeks of external radiation with an upfront brachytherapy boost. We discussed the available radiation techniques, and focused on the details and logistics of delivery. The patient is not a candidate for brachytherapy boost with a prostate volume of 141 cc. We discussed and outlined the risks, benefits, short and long-term effects associated with radiotherapy and compared and contrasted these with prostatectomy. We discussed the role of SpaceOAR gel in reducing the rectal toxicity associated with radiotherapy. He is already on ADT at this time.  He appears to have a good understanding of his disease and our treatment recommendations which are of curative intent.  He was encouraged to ask questions that were  answered to his stated satisfaction.  At the conclusion of our conversation, the patient is interested in moving forward with LT-ADT concurrent with 8 weeks of external beam radiotherapy.  He hasalready received his first Lupron injection in 08/2020, but has not had placement of gold fiducial markers/SpaceOAR gel.  We  will share our discussion with Dr. Tresa Moore and move forward with scheduling placement of 3 gold fiducial markers into the prostate, prior to CT simulation, in preparation to proceed with IMRT in the near future.   I spent 60 min in total on this encounter - MM    Nicholos Johns, PA-C    Tyler Pita, MD  [place]: [phone]  Fax: [phone] [email or web address]  Skype  [email or web address]   This document serves as a record of services personally performed by Tyler Pita, MD and Freeman Caldron, PA-C. It was created on their behalf by Wilburn Mylar, a trained medical scribe. The creation of this record is based on the scribe's personal observations and the provider's statements to them. This document has been checked and approved by the attending provider.

## 2021-01-02 ENCOUNTER — Encounter: Payer: Self-pay | Admitting: Urology

## 2021-01-02 NOTE — Progress Notes (Signed)
Per communication from [facility], the patient has decided to proceed with RALP with Dr. Tresa Moore.  He has a scheduled follow-up visit in the urology office on 02/04/2021.

## 2021-01-07 ENCOUNTER — Other Ambulatory Visit: Payer: Self-pay | Admitting: Urology

## 2021-01-09 DIAGNOSIS — R3915 Urgency of urination: Secondary | ICD-10-CM | POA: Diagnosis not present

## 2021-01-09 DIAGNOSIS — C61 Malignant neoplasm of prostate: Secondary | ICD-10-CM | POA: Diagnosis not present

## 2021-01-09 DIAGNOSIS — N5201 Erectile dysfunction due to arterial insufficiency: Secondary | ICD-10-CM | POA: Diagnosis not present

## 2021-01-09 NOTE — Patient Instructions (Addendum)
DUE TO COVID-19 ONLY ONE VISITOR IS ALLOWED TO COME WITH YOU AND STAY IN THE WAITING ROOM ONLY DURING PRE OP AND PROCEDURE DAY OF SURGERY. THE 1 VISITOR  MAY VISIT WITH YOU AFTER SURGERY IN YOUR PRIVATE ROOM DURING VISITING HOURS ONLY!  YOU NEED TO HAVE A COVID 19 TEST ON_3/26______ @__10 :15_____, THIS TEST MUST BE DONE BEFORE SURGERY,  COVID TESTING SITE [place] [address], IT IS ON THE RIGHT GOING OUT [address] APPROXIMATELY  2 MINUTES PAST [employer] ON THE RIGHT. ONCE YOUR COVID TEST IS COMPLETED,  PLEASE BEGIN THE QUARANTINE INSTRUCTIONS AS OUTLINED IN YOUR HANDOUT.                Kevin Gamble    Your procedure is scheduled on: 01/15/21   Report to [facility] Main  Entrance   Report to admitting at  7:15 AM     Call this number if you have problems the morning of surgery [phone]    Remember: Do not eat food or drink liquids :After Midnight  . BRUSH YOUR TEETH MORNING OF SURGERY AND RINSE YOUR MOUTH OUT, NO CHEWING GUM CANDY OR MINTS.     Take these medicines the morning of surgery with A SIP OF WATER: Dutasteride, Silodosin, Solifenacin                                 You may not have any metal on your body including               piercings  Do not wear jewelry,  lotions, powders or deodorant              Men may shave face and neck.   Do not bring valuables to the hospital. [place].  Contacts, dentures or bridgework may not be worn into surgery.                   Please read over the following fact sheets you were given: _____________________________________________________________________             [facility] - Preparing for Surgery Before surgery, you can play an important role.  Because skin is not sterile, your skin needs to be as free of germs as possible.  You can reduce the number of germs on your skin by washing with CHG (chlorahexidine gluconate)  soap before surgery.  CHG is an antiseptic cleaner which kills germs and bonds with the skin to continue killing germs even after washing. Please DO NOT use if you have an allergy to CHG or antibacterial soaps.  If your skin becomes reddened/irritated stop using the CHG and inform your nurse when you arrive at Short Stay.   You may shave your face/neck. Please follow these instructions carefully:  1.  Shower with CHG Soap the night before surgery and the  morning of Surgery.  2.  If you choose to wash your hair, wash your hair first as usual with your  normal  shampoo.  3.  After you shampoo, rinse your hair and body thoroughly to remove the  shampoo.  4.  Use CHG as you would any other liquid soap.  You can apply chg directly  to the skin and wash                       Gently with a scrungie or clean washcloth.  5.  Apply the CHG Soap to your body ONLY FROM THE NECK DOWN.   Do not use on face/ open                           Wound or open sores. Avoid contact with eyes, ears mouth and genitals (private parts).                       Wash face,  Genitals (private parts) with your normal soap.             6.  Wash thoroughly, paying special attention to the area where your surgery  will be performed.  7.  Thoroughly rinse your body with warm water from the neck down.  8.  DO NOT shower/wash with your normal soap after using and rinsing off  the CHG Soap.             9.  Pat yourself dry with a clean towel.            10.  Wear clean pajamas.            11.  Place clean sheets on your bed the night of your first shower and do not  sleep with pets. Day of Surgery : Do not apply any lotions/deodorants the morning of surgery.  Please wear clean clothes to the hospital/surgery center.  FAILURE TO FOLLOW THESE INSTRUCTIONS MAY RESULT IN THE CANCELLATION OF YOUR SURGERY PATIENT SIGNATURE_________________________________  NURSE  SIGNATURE__________________________________  ________________________________________________________________________

## 2021-01-10 ENCOUNTER — Encounter (HOSPITAL_COMMUNITY)
Admission: RE | Admit: 2021-01-10 | Discharge: 2021-01-10 | Disposition: A | Discharge status: Home / Self Care | Payer: Medicare PPO | Source: Ambulatory Visit | Attending: Urology | Admitting: Urology

## 2021-01-10 ENCOUNTER — Other Ambulatory Visit: Payer: Self-pay

## 2021-01-10 ENCOUNTER — Encounter (HOSPITAL_COMMUNITY): Payer: Self-pay

## 2021-01-10 DIAGNOSIS — Z01818 Encounter for other preprocedural examination: Secondary | ICD-10-CM | POA: Insufficient documentation

## 2021-01-10 LAB — BASIC METABOLIC PANEL
Anion gap: 7 (ref 5–15)
BUN: 19 mg/dL (ref 8–23)
CO2: 28 mmol/L (ref 22–32)
Calcium: 9.5 mg/dL (ref 8.9–10.3)
Chloride: 106 mmol/L (ref 98–111)
Creatinine, Ser: 1.2 mg/dL (ref 0.61–1.24)
GFR, Estimated: >60 mL/min (ref >60)
Glucose, Bld: 117 mg/dL — ABNORMAL HIGH (ref 70–99)
Potassium: 4.8 mmol/L (ref 3.5–5.1)
Sodium: 141 mmol/L (ref 135–145)

## 2021-01-10 LAB — CBC
HCT: 38.3 % — ABNORMAL LOW (ref 39.0–52.0)
Hemoglobin: 12.9 g/dL — ABNORMAL LOW (ref 13.0–17.0)
MCH: 33.1 pg (ref 26.0–34.0)
MCHC: 33.7 g/dL (ref 30.0–36.0)
MCV: 98.2 fL (ref 80.0–100.0)
Platelets: 172 10*3/uL (ref 150–400)
RBC: 3.9 MIL/uL — ABNORMAL LOW (ref 4.22–5.81)
RDW: 11.9 % (ref 11.5–15.5)
WBC: 5.8 10*3/uL (ref 4.0–10.5)
nRBC: 0 % (ref 0.0–0.2)

## 2021-01-10 NOTE — Progress Notes (Signed)
COVID Vaccine Completed:Yes Date COVID Vaccine completed:11/27/19 COVID vaccine manufacturer: Pfizer      PCP - Dr. Audie Pinto Cardiologist - no  Chest x-ray - no EKG - 01/10/21-chart Stress Test - no ECHO - no Cardiac Cath - no Pacemaker/ICD device last checked:NA  Sleep Study - no CPAP -   Fasting Blood Sugar - NA Checks Blood Sugar _____ times a day  Blood Thinner Instructions:NA Aspirin Instructions: Last Dose:  Anesthesia review:   Patient denies shortness of breath, fever, cough and chest pain at PAT appointment yes  Patient verbalized understanding of instructions that were given to them at the PAT appointment. Patient was also instructed that they will need to review over the PAT instructions again at home before surgery.Yes Pt roports no SOB with any activities

## 2021-01-11 ENCOUNTER — Other Ambulatory Visit (HOSPITAL_COMMUNITY)
Admission: RE | Admit: 2021-01-11 | Discharge: 2021-01-11 | Disposition: A | Discharge status: Home / Self Care | Payer: Medicare PPO | Source: Ambulatory Visit | Attending: Urology | Admitting: Urology

## 2021-01-11 DIAGNOSIS — Z20822 Contact with and (suspected) exposure to covid-19: Secondary | ICD-10-CM | POA: Diagnosis not present

## 2021-01-11 DIAGNOSIS — Z01812 Encounter for preprocedural laboratory examination: Secondary | ICD-10-CM | POA: Insufficient documentation

## 2021-01-11 LAB — SARS CORONAVIRUS 2 (TAT 6-24 HRS): SARS Coronavirus 2: NEGATIVE

## 2021-01-15 ENCOUNTER — Encounter (HOSPITAL_COMMUNITY): Payer: Self-pay | Admitting: Urology

## 2021-01-15 ENCOUNTER — Encounter (HOSPITAL_COMMUNITY)
Admission: RE | Disposition: A | Discharge status: Home / Self Care | Payer: Self-pay | Source: Ambulatory Visit | Attending: Urology

## 2021-01-15 ENCOUNTER — Other Ambulatory Visit: Payer: Self-pay

## 2021-01-15 ENCOUNTER — Ambulatory Visit (HOSPITAL_COMMUNITY): Payer: Medicare PPO | Admitting: Certified Registered Nurse Anesthetist

## 2021-01-15 ENCOUNTER — Observation Stay (HOSPITAL_COMMUNITY)
Admission: RE | Admit: 2021-01-15 | Discharge: 2021-01-16 | Disposition: A | Discharge status: Home / Self Care | Payer: Medicare PPO | Source: Ambulatory Visit | Attending: Urology | Admitting: Urology

## 2021-01-15 DIAGNOSIS — Z79899 Other long term (current) drug therapy: Secondary | ICD-10-CM | POA: Insufficient documentation

## 2021-01-15 DIAGNOSIS — D631 Anemia in chronic kidney disease: Secondary | ICD-10-CM | POA: Diagnosis not present

## 2021-01-15 DIAGNOSIS — H409 Unspecified glaucoma: Secondary | ICD-10-CM | POA: Diagnosis not present

## 2021-01-15 DIAGNOSIS — E785 Hyperlipidemia, unspecified: Secondary | ICD-10-CM | POA: Diagnosis not present

## 2021-01-15 DIAGNOSIS — I129 Hypertensive chronic kidney disease with stage 1 through stage 4 chronic kidney disease, or unspecified chronic kidney disease: Secondary | ICD-10-CM | POA: Diagnosis not present

## 2021-01-15 DIAGNOSIS — C61 Malignant neoplasm of prostate: Secondary | ICD-10-CM | POA: Diagnosis not present

## 2021-01-15 DIAGNOSIS — N183 Chronic kidney disease, stage 3 unspecified: Secondary | ICD-10-CM | POA: Insufficient documentation

## 2021-01-15 HISTORY — PX: ROBOT ASSISTED LAPAROSCOPIC RADICAL PROSTATECTOMY: SHX5141

## 2021-01-15 HISTORY — PX: LYMPHADENECTOMY: SHX5960

## 2021-01-15 LAB — HEMOGLOBIN AND HEMATOCRIT, BLOOD
HCT: 39.8 % (ref 39.0–52.0)
Hemoglobin: 13.4 g/dL (ref 13.0–17.0)

## 2021-01-15 SURGERY — PROSTATECTOMY, RADICAL, ROBOT-ASSISTED, LAPAROSCOPIC
Anesthesia: General

## 2021-01-15 MED ORDER — STERILE WATER FOR IRRIGATION IR SOLN
Status: DC | PRN
  Administered 2021-01-15: 1000 mL

## 2021-01-15 MED ORDER — CHLORHEXIDINE GLUCONATE 0.12 % MT SOLN
15.0000 mL | Freq: Once | OROMUCOSAL | Status: AC
  Administered 2021-01-15: 15 mL via OROMUCOSAL

## 2021-01-15 MED ORDER — INDOCYANINE GREEN 25 MG IV SOLR
INTRAVENOUS | Status: DC | PRN
Start: 2021-01-15 — End: 2021-01-15
  Administered 2021-01-15: 1.25 mg

## 2021-01-15 MED ORDER — SULFAMETHOXAZOLE-TRIMETHOPRIM 800-160 MG PO TABS: 1.0000 | ORAL_TABLET | Freq: Two times a day (BID) | ORAL | 0 refills | Status: DC

## 2021-01-15 MED ORDER — DIPHENHYDRAMINE HCL 50 MG/ML IJ SOLN: 12.5000 mg | Freq: Four times a day (QID) | INTRAMUSCULAR | Status: DC | PRN

## 2021-01-15 MED ORDER — DEXAMETHASONE SODIUM PHOSPHATE 10 MG/ML IJ SOLN
INTRAMUSCULAR | Status: DC | PRN
  Administered 2021-01-15: 5 mg via INTRAVENOUS

## 2021-01-15 MED ORDER — CEFAZOLIN SODIUM-DEXTROSE 2-4 GM/100ML-% IV SOLN
2.0000 g | INTRAVENOUS | Status: AC
  Administered 2021-01-15: 2 g via INTRAVENOUS
  Filled 2021-01-15: qty 100

## 2021-01-15 MED ORDER — ROCURONIUM BROMIDE 10 MG/ML (PF) SYRINGE
PREFILLED_SYRINGE | INTRAVENOUS | Status: AC
  Filled 2021-01-15: qty 10

## 2021-01-15 MED ORDER — PROPOFOL 10 MG/ML IV BOLUS
INTRAVENOUS | Status: DC | PRN
  Administered 2021-01-15: 100 mg via INTRAVENOUS
  Administered 2021-01-15: 10 mg via INTRAVENOUS

## 2021-01-15 MED ORDER — LIDOCAINE 2% (20 MG/ML) 5 ML SYRINGE
INTRAMUSCULAR | Status: AC
  Filled 2021-01-15: qty 5

## 2021-01-15 MED ORDER — FENTANYL CITRATE (PF) 100 MCG/2ML IJ SOLN
INTRAMUSCULAR | Status: AC
  Filled 2021-01-15: qty 2

## 2021-01-15 MED ORDER — PROPOFOL 10 MG/ML IV BOLUS
INTRAVENOUS | Status: AC
  Filled 2021-01-15: qty 20

## 2021-01-15 MED ORDER — SODIUM CHLORIDE (PF) 0.9 % IJ SOLN
INTRAMUSCULAR | Status: DC | PRN
  Administered 2021-01-15: 20 mL

## 2021-01-15 MED ORDER — DEXAMETHASONE SODIUM PHOSPHATE 10 MG/ML IJ SOLN
INTRAMUSCULAR | Status: AC
  Filled 2021-01-15: qty 1

## 2021-01-15 MED ORDER — DOCUSATE SODIUM 100 MG PO CAPS
100.0000 mg | ORAL_CAPSULE | Freq: Two times a day (BID) | ORAL | Status: DC
  Administered 2021-01-15 – 2021-01-16 (×2): 100 mg via ORAL
  Filled 2021-01-15 (×2): qty 1

## 2021-01-15 MED ORDER — ONDANSETRON HCL 4 MG/2ML IJ SOLN: 4.0000 mg | Freq: Once | INTRAMUSCULAR | Status: DC | PRN

## 2021-01-15 MED ORDER — HYDROCODONE-ACETAMINOPHEN 5-325 MG PO TABS
1.0000 | ORAL_TABLET | Freq: Four times a day (QID) | ORAL | 0 refills | Status: DC | PRN
Start: 2021-01-15 — End: 2021-05-28

## 2021-01-15 MED ORDER — MIDAZOLAM HCL 5 MG/5ML IJ SOLN
INTRAMUSCULAR | Status: DC | PRN
  Administered 2021-01-15: 2 mg via INTRAVENOUS

## 2021-01-15 MED ORDER — DIPHENHYDRAMINE HCL 12.5 MG/5ML PO ELIX: 12.5000 mg | ORAL_SOLUTION | Freq: Four times a day (QID) | ORAL | Status: DC | PRN

## 2021-01-15 MED ORDER — ONDANSETRON HCL 4 MG/2ML IJ SOLN
INTRAMUSCULAR | Status: AC
  Filled 2021-01-15: qty 2

## 2021-01-15 MED ORDER — LACTATED RINGERS IR SOLN
Status: DC | PRN
  Administered 2021-01-15: 3000 mL

## 2021-01-15 MED ORDER — KETAMINE HCL 10 MG/ML IJ SOLN
INTRAMUSCULAR | Status: AC
  Filled 2021-01-15: qty 1

## 2021-01-15 MED ORDER — OXYCODONE HCL 5 MG/5ML PO SOLN: 5.0000 mg | Freq: Once | ORAL | Status: DC | PRN

## 2021-01-15 MED ORDER — SUGAMMADEX SODIUM 200 MG/2ML IV SOLN
INTRAVENOUS | Status: DC | PRN
  Administered 2021-01-15: 300 mg via INTRAVENOUS

## 2021-01-15 MED ORDER — FENTANYL CITRATE (PF) 250 MCG/5ML IJ SOLN
INTRAMUSCULAR | Status: DC | PRN
  Administered 2021-01-15 (×2): 50 ug via INTRAVENOUS

## 2021-01-15 MED ORDER — HYDROMORPHONE HCL 1 MG/ML IJ SOLN
0.5000 mg | INTRAMUSCULAR | Status: DC | PRN
  Administered 2021-01-15: 1 mg via INTRAVENOUS
  Filled 2021-01-15: qty 1

## 2021-01-15 MED ORDER — ORAL CARE MOUTH RINSE: 15.0000 mL | Freq: Once | OROMUCOSAL | Status: AC

## 2021-01-15 MED ORDER — BELLADONNA ALKALOIDS-OPIUM 16.2-60 MG RE SUPP: 1.0000 | Freq: Four times a day (QID) | RECTAL | Status: DC | PRN

## 2021-01-15 MED ORDER — ACETAMINOPHEN 500 MG PO TABS
1000.0000 mg | ORAL_TABLET | Freq: Four times a day (QID) | ORAL | Status: AC
  Administered 2021-01-15 – 2021-01-16 (×4): 1000 mg via ORAL
  Filled 2021-01-15 (×4): qty 2

## 2021-01-15 MED ORDER — DOCUSATE SODIUM 100 MG PO CAPS: 100.0000 mg | ORAL_CAPSULE | Freq: Two times a day (BID) | ORAL | Status: DC

## 2021-01-15 MED ORDER — LOSARTAN POTASSIUM 25 MG PO TABS
25.0000 mg | ORAL_TABLET | Freq: Every day | ORAL | Status: DC
  Administered 2021-01-16: 25 mg via ORAL
  Filled 2021-01-15 (×2): qty 1

## 2021-01-15 MED ORDER — PHENYLEPHRINE 40 MCG/ML (10ML) SYRINGE FOR IV PUSH (FOR BLOOD PRESSURE SUPPORT)
PREFILLED_SYRINGE | INTRAVENOUS | Status: AC
  Filled 2021-01-15: qty 10

## 2021-01-15 MED ORDER — ROCURONIUM BROMIDE 10 MG/ML (PF) SYRINGE
PREFILLED_SYRINGE | INTRAVENOUS | Status: DC | PRN
  Administered 2021-01-15 (×2): 20 mg via INTRAVENOUS
  Administered 2021-01-15 (×2): 40 mg via INTRAVENOUS

## 2021-01-15 MED ORDER — FENTANYL CITRATE (PF) 100 MCG/2ML IJ SOLN
25.0000 ug | INTRAMUSCULAR | Status: DC | PRN
  Administered 2021-01-15 (×2): 50 ug via INTRAVENOUS

## 2021-01-15 MED ORDER — SODIUM CHLORIDE 0.9 % IV BOLUS
1000.0000 mL | Freq: Once | INTRAVENOUS | Status: AC
  Administered 2021-01-15: 1000 mL via INTRAVENOUS

## 2021-01-15 MED ORDER — ONDANSETRON HCL 4 MG/2ML IJ SOLN: 4.0000 mg | INTRAMUSCULAR | Status: DC | PRN

## 2021-01-15 MED ORDER — OXYCODONE HCL 5 MG PO TABS: 5.0000 mg | ORAL_TABLET | Freq: Once | ORAL | Status: DC | PRN

## 2021-01-15 MED ORDER — BACITRACIN-NEOMYCIN-POLYMYXIN 400-5-5000 EX OINT: 1.0000 "application " | TOPICAL_OINTMENT | Freq: Three times a day (TID) | CUTANEOUS | Status: DC | PRN

## 2021-01-15 MED ORDER — LACTATED RINGERS IV SOLN: INTRAVENOUS | Status: DC | PRN

## 2021-01-15 MED ORDER — OXYCODONE HCL 5 MG PO TABS
5.0000 mg | ORAL_TABLET | ORAL | Status: DC | PRN
  Administered 2021-01-15 – 2021-01-16 (×2): 5 mg via ORAL
  Filled 2021-01-15 (×2): qty 1

## 2021-01-15 MED ORDER — BUPIVACAINE LIPOSOME 1.3 % IJ SUSP
INTRAMUSCULAR | Status: DC | PRN
  Administered 2021-01-15: 20 mL

## 2021-01-15 MED ORDER — MIDAZOLAM HCL 2 MG/2ML IJ SOLN
INTRAMUSCULAR | Status: AC
  Filled 2021-01-15: qty 2

## 2021-01-15 MED ORDER — BUPIVACAINE LIPOSOME 1.3 % IJ SUSP
20.0000 mL | Freq: Once | INTRAMUSCULAR | Status: DC
  Filled 2021-01-15: qty 20

## 2021-01-15 MED ORDER — MAGNESIUM CITRATE PO SOLN: 1.0000 | Freq: Once | ORAL | Status: DC

## 2021-01-15 MED ORDER — ONDANSETRON HCL 4 MG/2ML IJ SOLN
INTRAMUSCULAR | Status: DC | PRN
  Administered 2021-01-15: 4 mg via INTRAVENOUS

## 2021-01-15 MED ORDER — DEXTROSE-NACL 5-0.45 % IV SOLN: INTRAVENOUS | Status: DC

## 2021-01-15 MED ORDER — SUCCINYLCHOLINE CHLORIDE 200 MG/10ML IV SOSY
PREFILLED_SYRINGE | INTRAVENOUS | Status: AC
  Filled 2021-01-15: qty 10

## 2021-01-15 MED ORDER — LACTATED RINGERS IV SOLN: INTRAVENOUS | Status: DC

## 2021-01-15 MED ORDER — KETAMINE HCL 10 MG/ML IJ SOLN
INTRAMUSCULAR | Status: DC | PRN
  Administered 2021-01-15 (×2): 10 mg via INTRAVENOUS
  Administered 2021-01-15: 30 mg via INTRAVENOUS

## 2021-01-15 MED ORDER — LIDOCAINE 2% (20 MG/ML) 5 ML SYRINGE
INTRAMUSCULAR | Status: DC | PRN
  Administered 2021-01-15: 60 mg via INTRAVENOUS

## 2021-01-15 SURGICAL SUPPLY — 70 items
ADH SKN CLS APL DERMABOND .7 (GAUZE/BANDAGES/DRESSINGS) ×2
APL PRP STRL LF DISP 70% ISPRP (MISCELLANEOUS) ×4
APPLICATOR COTTON TIP 6 STRL (MISCELLANEOUS) ×2 IMPLANT
APPLICATOR COTTON TIP 6IN STRL (MISCELLANEOUS) ×3
CATH FOLEY 2WAY SLVR 18FR 30CC (CATHETERS) ×3 IMPLANT
CATH TIEMANN FOLEY 18FR 5CC (CATHETERS) ×3 IMPLANT
CHLORAPREP W/TINT 26 (MISCELLANEOUS) ×6 IMPLANT
CLIP VESOLOCK LG 6/CT PURPLE (CLIP) ×12 IMPLANT
CNTNR URN SCR LID CUP LEK RST (MISCELLANEOUS) ×2 IMPLANT
CONT SPEC 4OZ STRL OR WHT (MISCELLANEOUS) ×3
COVER SURGICAL LIGHT HANDLE (MISCELLANEOUS) ×3 IMPLANT
COVER TIP SHEARS 8 DVNC (MISCELLANEOUS) ×2 IMPLANT
COVER TIP SHEARS 8MM DA VINCI (MISCELLANEOUS) ×3
COVER WAND RF STERILE (DRAPES) IMPLANT
CUTTER ECHEON FLEX ENDO 45 340 (ENDOMECHANICALS) ×3 IMPLANT
DECANTER SPIKE VIAL GLASS SM (MISCELLANEOUS) ×3 IMPLANT
DERMABOND ADVANCED (GAUZE/BANDAGES/DRESSINGS) ×1
DERMABOND ADVANCED .7 DNX12 (GAUZE/BANDAGES/DRESSINGS) ×2 IMPLANT
DRAIN CHANNEL RND F F (WOUND CARE) IMPLANT
DRAPE ARM DVNC X/XI (DISPOSABLE) ×8 IMPLANT
DRAPE COLUMN DVNC XI (DISPOSABLE) ×2 IMPLANT
DRAPE DA VINCI XI ARM (DISPOSABLE) ×12
DRAPE DA VINCI XI COLUMN (DISPOSABLE) ×3
DRAPE SURG IRRIG POUCH 19X23 (DRAPES) ×3 IMPLANT
DRSG TEGADERM 2-3/8X2-3/4 SM (GAUZE/BANDAGES/DRESSINGS) ×3 IMPLANT
DRSG TEGADERM 4X4.75 (GAUZE/BANDAGES/DRESSINGS) ×3 IMPLANT
ELECT PENCIL ROCKER SW 15FT (MISCELLANEOUS) IMPLANT
ELECT REM PT RETURN 15FT ADLT (MISCELLANEOUS) ×3 IMPLANT
GAUZE 4X4 16PLY RFD (DISPOSABLE) IMPLANT
GAUZE SPONGE 2X2 8PLY STRL LF (GAUZE/BANDAGES/DRESSINGS) IMPLANT
GAUZE SPONGE 4X4 12PLY STRL (GAUZE/BANDAGES/DRESSINGS) ×3 IMPLANT
GLOVE SURG ENC MOIS LTX SZ6.5 (GLOVE) ×3 IMPLANT
GLOVE SURG ENC TEXT LTX SZ7.5 (GLOVE) ×6 IMPLANT
GLOVE SURG UNDER POLY LF SZ7 (GLOVE) ×3 IMPLANT
GLOVE SURG UNDER POLY LF SZ7.5 (GLOVE) ×3 IMPLANT
GOWN STRL REUS W/TWL LRG LVL3 (GOWN DISPOSABLE) ×9 IMPLANT
HOLDER FOLEY CATH W/STRAP (MISCELLANEOUS) ×3 IMPLANT
IRRIG SUCT STRYKERFLOW 2 WTIP (MISCELLANEOUS) ×3
IRRIGATION SUCT STRKRFLW 2 WTP (MISCELLANEOUS) ×2 IMPLANT
IV LACTATED RINGERS 1000ML (IV SOLUTION) ×3 IMPLANT
KIT PROCEDURE DA VINCI SI (MISCELLANEOUS) ×3
KIT PROCEDURE DVNC SI (MISCELLANEOUS) ×2 IMPLANT
KIT TURNOVER KIT A (KITS) ×3 IMPLANT
NEEDLE INSUFFLATION 14GA 120MM (NEEDLE) ×3 IMPLANT
NEEDLE SPNL 22GX7 QUINCKE BK (NEEDLE) ×3 IMPLANT
PACK ROBOT UROLOGY CUSTOM (CUSTOM PROCEDURE TRAY) ×3 IMPLANT
PAD POSITIONING PINK XL (MISCELLANEOUS) ×3 IMPLANT
PORT ACCESS TROCAR AIRSEAL 12 (TROCAR) ×2 IMPLANT
PORT ACCESS TROCAR AIRSEAL 5M (TROCAR) ×1
SEAL CANN UNIV 5-8 DVNC XI (MISCELLANEOUS) ×10 IMPLANT
SEAL XI 5MM-8MM UNIVERSAL (MISCELLANEOUS) ×15
SET TRI-LUMEN FLTR TB AIRSEAL (TUBING) ×3 IMPLANT
SOLUTION ELECTROLUBE (MISCELLANEOUS) ×3 IMPLANT
SPONGE GAUZE 2X2 STER 10/PKG (GAUZE/BANDAGES/DRESSINGS)
SPONGE LAP 4X18 RFD (DISPOSABLE) ×3 IMPLANT
STAPLE RELOAD 45 GRN (STAPLE) ×2 IMPLANT
STAPLE RELOAD 45MM GREEN (STAPLE) ×3
SUT ETHILON 3 0 PS 1 (SUTURE) ×3 IMPLANT
SUT MNCRL AB 4-0 PS2 18 (SUTURE) ×6 IMPLANT
SUT PDS AB 1 CT1 27 (SUTURE) ×6 IMPLANT
SUT VIC AB 2-0 CT2 27 (SUTURE) ×12 IMPLANT
SUT VIC AB 2-0 SH 27 (SUTURE) ×3
SUT VIC AB 2-0 SH 27X BRD (SUTURE) ×2 IMPLANT
SUT VICRYL 0 UR6 27IN ABS (SUTURE) ×6 IMPLANT
SUT VLOC BARB 180 ABS3/0GR12 (SUTURE) ×9
SUTURE VLOC BRB 180 ABS3/0GR12 (SUTURE) ×6 IMPLANT
SYR 27GX1/2 1ML LL SAFETY (SYRINGE) ×3 IMPLANT
TOWEL OR NON WOVEN STRL DISP B (DISPOSABLE) ×3 IMPLANT
TROCAR XCEL NON-BLD 5MMX100MML (ENDOMECHANICALS) IMPLANT
WATER STERILE IRR 1000ML POUR (IV SOLUTION) ×3 IMPLANT

## 2021-01-15 NOTE — Plan of Care (Signed)
  Problem: Clinical Measurements: Goal: Ability to maintain clinical measurements within normal limits will improve Outcome: Progressing Goal: Will remain free from infection Outcome: Progressing   

## 2021-01-15 NOTE — H&P (Signed)
Kevin Gamble is an 75 y.o. male.    Chief Complaint: Pre-Op Radical Prostatectomy / Node Dissection  HPI:   1 - High Risk Prostate Cancer - 4/12 core up to 70% Grade 4 cancer (all base cores) by TRUS 2021 on eval rising PSA to 11 on avodart. Had negative BX around 2016. TRUS 169mL with at least 50% median lobe. CT and Bone scan 07/2020 with diffuse bilateral rib lesions (has h/o old injury) , no adenopathy. Dedicated chest MRI and PET-CT w/o overt mets.   Recent Course:  08/2020 - Eligard 45; 09/2020 PSA 5.15, T 10, CMP Nl  10/2020 - T <10, PSA 0.99, CMP Nl   2 - Lower Urinary Tract Symptoms / Very large prostate - on vesicre + rapaflo + dutasteride for control of LUTS from VERY large proste. PVR 2019<62mL (normal).   PMH sig for HTN, HLD. No prior surgeries. No ischemic CV disease. 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 His PCP is Deland Pretty MD.   Today "Kevin Gamble" is seen to proceed with radical prostatectomy for high grade cancer in very large prostate. No interval fevers. Hgb 12.9, C19 screen negative.     Past Medical History:  Diagnosis Date  . BPH (benign prostatic hyperplasia) 2015   With increased urinary frequency --> Dr. Pilar Jarvis (Urology) - elevated PSA - Bx Neg   . Chronic kidney disease    stage 3 kidney disease   . Essential hypertension   . Glaucoma    Dr. Katy Fitch  . Hyperlipidemia due to dietary fat intake    Managed with diet control  . Internal hemorrhoids   . Prostate cancer [facility])     Past Surgical History:  Procedure Laterality Date  . COLONOSCOPY  2019  . EYE SURGERY Bilateral 2010  . PROSTATE BIOPSY  2015, 2016   Dr. Vance Gather    Family History  Problem Relation Age of Onset  . Other Mother        Unknonwn cause/history - died when pt was young  . Other Father        Unknonwn cause/history - died when pt was young  . Healthy Sister   . Hypertension Brother   . Colon cancer Neg Hx   . Esophageal cancer Neg Hx   . Rectal cancer  Neg Hx   . Stomach cancer Neg Hx    Social History:  reports that he has never smoked. He has never used smokeless tobacco. He reports that he does not drink alcohol and does not use drugs.  Allergies: No Known Allergies  Medications Prior to Admission  Medication Sig Dispense Refill  . Coenzyme Q10 (CO Q-10) 100 MG CAPS Take 100 mg by mouth daily.    [name] dutasteride (AVODART) 0.5 MG capsule Take 0.5 mg by mouth daily.    . Flaxseed, Linseed, (FLAXSEED OIL) 1400 MG CAPS Take 1,400 mg by mouth daily.    [name] losartan (COZAAR) 25 MG tablet Take 25 mg by mouth daily.    . Multiple Vitamin (MULTIVITAMINS PO) Take 1 tablet by mouth daily.    . silodosin (RAPAFLO) 4 MG CAPS capsule Take 4 mg by mouth daily.    . solifenacin (VESICARE) 5 MG tablet Take 5 mg by mouth daily.      No results found for this or any previous visit (from the past 48 hour(s)). No results found.  Review of Systems  Constitutional: Negative for chills and fever.  All other systems reviewed and are negative.  There were no vitals taken for this visit. Physical Exam Vitals reviewed.  Constitutional:      Comments: Thin habitus. Very pleasant.   HENT:     Head: Normocephalic.     Nose: Nose normal.  Eyes:     Pupils: Pupils are equal, round, and reactive to light.  Cardiovascular:     Rate and Rhythm: Normal rate.     Pulses: Normal pulses.  Pulmonary:     Effort: Pulmonary effort is normal.  Abdominal:     General: Abdomen is flat.  Genitourinary:    Comments: No CVAT at present.  Musculoskeletal:        General: Normal range of motion.     Cervical back: Normal range of motion.  Skin:    General: Skin is warm.  Neurological:     General: No focal deficit present.     Mental Status: He is alert.  Psychiatric:        Mood and Affect: Mood normal.      Assessment/Plan  Proceed as planned with radical prostatectomy / node dissection. Risks, benefits, alternatives, expected peri-op course discused  previously and reiterated today.   Alexis Frock, MD 01/15/2021, 7:15 AM

## 2021-01-15 NOTE — Discharge Instructions (Signed)

## 2021-01-15 NOTE — Op Note (Signed)
NAME: Kevin Gamble. MEDICAL RECORD NO: [id number] ACCOUNT NO: [id number] DATE OF BIRTH: 08/25/1946 FACILITY: [facility]ATION: [facility] PHYSICIAN: Alexis Frock, MD  Operative Report   DATE OF PROCEDURE: 01/15/2021  PREOPERATIVE DIAGNOSIS:  High-risk prostate cancer and large prostate.  PROCEDURES:  Robotic-assisted laparoscopic radical prostatectomy, bilateral pelvic lymph node dissection.  ESTIMATED BLOOD LOSS:  300 mL.  COMPLICATIONS:  None.  SPECIMENS: 1.  Right external iliac lymph nodes. 2.  Right obturator lymph nodes. 3.  Left external iliac lymph nodes. 4.  Left obturator lymph nodes. 5.  Periprosthetic fat. 6.  Radical prostatectomy.  ASSISTANT:   Bari Mantis, PA  FINDINGS: 1.  Multiple sentinel lymph nodes denoted as such on pathology requisition. 2.  Large median lobe prostate anatomy as anticipated.  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  Prostate is quite large at 140 g.  There is very large median  lobe.  He underwent staging imaging, which was originally actually quite concerning for metastatic disease.  However, on further history and evaluation, it was determined that he has suspicious foci of bony involvement or, in fact, most likely consistent  with prior significant trauma as a child.  With these now appearing more localized, it did appear that there is a window for cure and curative options were again rediscussed including hormones and radiation versus surgical extirpation.  Given his very  large prostate with median lobe anatomy, it was felt that surgical extirpation would be most prudent to avoid future urinary retention.  He wished to proceed.  Informed consent was obtained and placed in the medical record.  DESCRIPTION OF PROCEDURE:  The patient being Kevin Gamble verified, the procedure being  radical prostatectomy was confirmed.  Procedure timeout was performed.  Intravenous antibiotics were administered.  General endotracheal anesthesia was induced.   The patient was placed into a low lithotomy position.  Sterile field was created, prepped and draped the penis, perineum, and proximal thighs using iodine.  He was further fastened to the operating table using 3-inch tape over foam padding across the  supraxiphoid chest.  After clipper shaving, his abdomen was prepped using chlorhexidine gluconate.  Sequential compression devices were applied.  The arms were tucked to the side with gel rolls.  Initial high flow, low pressure  pneumoperitoneum was obtained with Veress technique in supraumbilical midline having passed the aspiration drop test after Foley catheter was placed to straight drain.  An 8 mm robotic camera port was then placed in same location.  Laparoscopic  examination of the peritoneal cavity revealed no significant adhesions, no visceral injury.  Additional ports were placed as follows:  Right paramedian 8 mm robotic port, right far lateral 12 mm AirSeal assistant port, right paramedian 5 mm suction port,  left paramedian 8 mm robotic port, left far lateral 8 mm robotic port.  Robot was docked and passed electronic checks.  Initial attention was directed at development of the space of Retzius.  Incision was made lateral to the right medial lobe from the  midline towards the area of the internal ring coursing along the iliac vessels towards the area of the right ureter, which was positively identified.  The right vas deferens was purposely ligated and used as a bucket handle, and the right bladder wall  was swept away from the pelvic sidewall towards the area of the endopelvic fascia.  A mirror image  dissection was performed on the left side.  Anterior attachments were taken down using cautery scissors.  This exposed the anterior base of the prostate,  which was defatted to better denote  the bladder neck junction with this fat set aside and labeled as periprosthetic fat.  Next, 0.2 mL of indocyanine green dye was injected into each lobe of the prostate using a percutaneously placed robotically guided  spinal needle with intravenous suctioning to prevent spillage, which did not occur.  Next, the endopelvic fascia was carefully swept away from the lateral aspect of the prostate in a base to apex orientation to expose the dorsal venous complex.  It was  very carefully controlled using a green load stapler taking exquisite care to avoid membranous urethral injury.  Then, approximately 10 minutes post dye injection, the pelvis was inspected under near infrared fluorescence light.  Sentinel lymphangiography revealed excellent parenchyma uptake of the prostate in multiple sentinel lymphatic channels coursing towards the lymph node fields bilaterally.  Standard template lymph node dissection was performed first on the right side of  the right external iliac group with the boundaries being right external iliac artery, vein, pelvic sidewall, iliac bifurcation.  Lymphostasis was achieved with cold clips, set aside and labeled as right external iliac lymph nodes.  Next, right obturator  group was dissected free with the boundaries being right external iliac vein, pelvic sidewall, obturator nerve.  Lymphostasis was achieved with cold clips, set aside and labeled as right obturator lymph nodes.  Right obturator nerve was inspected  following maneuvers and found to be uninjured.  Next, left-sided lymph node dissection was performed as per the right of left external iliac and left obturator group respectively, and similarly, the left obturator nerve was inspected and found to be  uninjured following this.  Attention was then directed at bladder neck dissection.  Bladder neck was identified by moving the Foley catheter back and forth, and a very careful lateral release was performed on each side to better  denote the bladder neck  caliber, and the bladder neck was carefully separated from the base of the prostate in the anterior posterior direction keeping what appeared to be the rim of circular muscle fibers at each plane of dissection.  I was quite happy with bladder neck  caliber with respect to the lateral lobes of the prostate.  The patient has a massive known median lobe.  This was very carefully delivered via the bladder neck, and a figure-of-eight Vicryl stay suture applied on the right lateral aspects, which allowed  manipulation of this superiorly.  The ureteral orifices were easily and positively identified, and posterior dissection was performed of the posterior aspect of the bladder neck releasing this away from the large prostate.  The plane of Denonvilliers  was then entered, dissected towards the area of the base of the prostate.  Bilateral vas segments were encountered, ligated after being dissected free for approximately 4 cm and placed on gentle superior traction.  Bilateral seminal vesicles were  dissected to their tips and was placed on gentle superior traction.  This exposed the vascular pedicles on each side, which were controlled using a sequential clipping technique in a base to apex orientation performing minimal nerve sparing given the  patient's high risk indication.  Final apical dissection was performed in the anterior plane by placing the prostate on superior traction and transecting the membranous urethra coldly.  This completely freed up the prostate, and the specimen was placed  in the EndoCatch bag  for later retrieval.  Next, digital rectal exam was performed using indicator glove.  No evidence of rectal violation was noted.  Posterior reconstruction was then performed using a 3-0 V-Loc suture reapproximating the posterior  urethral plate to posterior bladder neck.  Again, I was quite happy with the bladder neck caliber.  This did not require any formal tailoring.  As such,  mucosa-to-mucosa anastomosis was performed using 3-0 V-Loc suture in double armed fashion from 6  o'clock to 12 o'clock position circumferentially, which showed an excellent tension-free apposition.  A Foley catheter was then placed per urethra, which irrigated quantitatively, to straight drain.  This completed the extirpated portion of the surgery  today.  Sponge and needle counts were correct.  A closed suction drain was brought out the previous left lateral most robotic port site near the peritoneal cavity.  The previous right lateral most assistant port site was closed at the fascia using  Carter-Thomason suture passer with 0 Vicryl.  The specimen was retrieved by extending the previous camera port site superiorly for a distance of approximately 4 cm removing the prostatectomy specimen setting aside for permanent pathology.  The extraction  site was closed at the level of fascia using figure-of-eight PDS x3 followed by reapproximation of Scarpa's with running Vicryl.  All incision sites were infiltrated with dilute lipolyzed Marcaine and closed at the level of the skin using subcuticular  Monocryl followed by Dermabond.  The procedure was then terminated.  The patient tolerated the procedure well.  No immediate perioperative complications.  The patient was taken to postanesthesia care unit in stable condition with plan for observation  admission.  Please note, first assistant, Bari Mantis, was crucial for all portions of the surgery today.  She provided invaluable retraction, suctioning, vascular clipping, vascular stapling, specimen manipulation, and general first assistance.   [facility] D: 01/15/2021 12:34:59 pm T: 01/15/2021 3:00:00 pm  JOB: [id number]/ [id number]

## 2021-01-15 NOTE — Anesthesia Preprocedure Evaluation (Addendum)
Anesthesia Evaluation  Patient identified by MRN, date of birth, ID band Patient awake    Reviewed: Allergy & Precautions, NPO status , Patient's Chart, lab work & pertinent test results  History of Anesthesia Complications Negative for: history of anesthetic complications  Airway Mallampati: II  TM Distance: >3 FB Neck ROM: Full    Dental  (+) Dental Advisory Given, Teeth Intact   Pulmonary neg pulmonary ROS,    Pulmonary exam normal        Cardiovascular hypertension, Pt. on medications Normal cardiovascular exam+ dysrhythmias    '20 Holter monitor - Event monitor showed predominant underlying rhythm sinus rhythm with first-degree AV block (along with bundle branch block) Minimum heart rate 41 bpm, maximum heart rate 143 bpm with average heart rate 58 bpm. No prolonged pauses noted. 15 relatively short bursts of PAT/SVT ranging from 5-15 beats --> patient trigger Relatively rare PACs and PVCs noted. No couplets, triplets or bigeminy/trigeminy noted. At this point in time, heart rate seems to be relatively stable and would not be restaged immediately consider pacemaker. Relatively short burst of PAT are probably not overly concerning, and probably would avoid beta-blocker given trifascicular block. We will simply need to monitor for symptoms of chronotropic incompetence which is not apparent here.  '20 TTE - EF 60-65%. Mildly increased LVH. LV diastolic Doppler parameters are consistent with impaired relaxation. Aortic valve regurgitation is mild. There is mild dilatation of the ascending aorta measuring 38 mm.     Neuro/Psych negative neurological ROS  negative psych ROS   GI/Hepatic negative GI ROS, Neg liver ROS,   Endo/Other  negative endocrine ROS  Renal/GU CRFRenal disease    Prostate cancer     Musculoskeletal negative musculoskeletal ROS (+)   Abdominal   Peds  Hematology  (+) anemia ,    Anesthesia Other Findings Covid test negative   Reproductive/Obstetrics                            Anesthesia Physical Anesthesia Plan  ASA: III  Anesthesia Plan: General   Post-op Pain Management:    Induction: Intravenous  PONV Risk Score and Plan: 2 and Treatment may vary due to age or medical condition, Ondansetron and Dexamethasone  Airway Management Planned: Oral ETT  Additional Equipment: None  Intra-op Plan:   Post-operative Plan: Extubation in OR  Informed Consent: I have reviewed the patients History and Physical, chart, labs and discussed the procedure including the risks, benefits and alternatives for the proposed anesthesia with the patient or authorized representative who has indicated his/her understanding and acceptance.     Dental advisory given  Plan Discussed with: CRNA and Anesthesiologist  Anesthesia Plan Comments:        Anesthesia Quick Evaluation

## 2021-01-15 NOTE — Brief Op Note (Signed)
01/15/2021  12:25 PM  PATIENT:  Kevin Gamble  75 y.o. male  PRE-OPERATIVE DIAGNOSIS:  PROSTATE CANCER IN VERY LARGE PROSTATE  POST-OPERATIVE DIAGNOSIS:  PROSTATE CANCER IN VERY LARGE PROSTATE  PROCEDURE:  Procedure(s) with comments: XI ROBOTIC ASSISTED LAPAROSCOPIC RADICAL PROSTATECTOMY WITH INDOCYANINE GREEN DYE INJECTION (N/A) - 3 HRS LYMPHADENECTOMY (Bilateral)  SURGEON:  Surgeon(s) and Role:    Alexis Frock, MD - Primary  PHYSICIAN ASSISTANT:   ASSISTANTS: Clemetine Marker PA   ANESTHESIA:   local and general  EBL:  300 mL   BLOOD ADMINISTERED:none  DRAINS: 1 - JP to bulb; 2- Foley to gravity   LOCAL MEDICATIONS USED:  MARCAINE     SPECIMEN:  Source of Specimen:  1- periprostatic fat; 2- pelvic lymph nodes; 3 - prostatectomy  DISPOSITION OF SPECIMEN:  PATHOLOGY  COUNTS:  YES  TOURNIQUET:  * No tourniquets in log *  DICTATION: .Other Dictation: Dictation Number  [id number]  PLAN OF CARE: Admit for overnight observation  PATIENT DISPOSITION:  PACU - hemodynamically stable.   Delay start of Pharmacological VTE agent (>24hrs) due to surgical blood loss or risk of bleeding: yes

## 2021-01-15 NOTE — Anesthesia Postprocedure Evaluation (Signed)
Anesthesia Post Note  Patient: LAMBROS CERRO  Procedure(s) Performed: XI ROBOTIC ASSISTED LAPAROSCOPIC RADICAL PROSTATECTOMY WITH INDOCYANINE GREEN DYE INJECTION (N/A ) LYMPHADENECTOMY (Bilateral )     Patient location during evaluation: PACU Anesthesia Type: General Level of consciousness: awake and alert Pain management: pain level controlled Vital Signs Assessment: post-procedure vital signs reviewed and stable Respiratory status: spontaneous breathing, nonlabored ventilation, respiratory function stable and patient connected to nasal cannula oxygen Cardiovascular status: blood pressure returned to baseline and stable Postop Assessment: no apparent nausea or vomiting Anesthetic complications: no   No complications documented.  Last Vitals:  Vitals:   01/15/21 1415 01/15/21 1504  BP: (!) 156/92 (!) 155/87  Pulse: 67 73  Resp: 11 18  Temp: 36.9 C 37.2 C  SpO2: 100% 100%    Last Pain:  Vitals:   01/15/21 1504  TempSrc: Oral  PainSc:                  Tiajuana Amass

## 2021-01-15 NOTE — Anesthesia Procedure Notes (Signed)
Procedure Name: Intubation Performed by: Rosaland Lao, CRNA Pre-anesthesia Checklist: Patient identified, Emergency Drugs available, Suction available and Patient being monitored Patient Re-evaluated:Patient Re-evaluated prior to induction Oxygen Delivery Method: Circle system utilized Preoxygenation: Pre-oxygenation with 100% oxygen Induction Type: IV induction Ventilation: Mask ventilation without difficulty Laryngoscope Size: Miller and 3 Grade View: Grade I Tube type: Oral Tube size: 7.5 mm Number of attempts: 1 Airway Equipment and Method: Stylet Placement Confirmation: ETT inserted through vocal cords under direct vision,  positive ETCO2 and breath sounds checked- equal and bilateral Secured at: 22 cm Tube secured with: Tape Dental Injury: Teeth and Oropharynx as per pre-operative assessment

## 2021-01-15 NOTE — Transfer of Care (Signed)
Immediate Anesthesia Transfer of Care Note  Patient: Kevin Gamble  Procedure(s) Performed: XI ROBOTIC ASSISTED LAPAROSCOPIC RADICAL PROSTATECTOMY WITH INDOCYANINE GREEN DYE INJECTION (N/A ) LYMPHADENECTOMY (Bilateral )  Patient Location: PACU  Anesthesia Type:General  Level of Consciousness: awake, alert  and oriented  Airway & Oxygen Therapy: Patient Spontanous Breathing and Patient connected to face mask  Post-op Assessment: Report given to RN and Post -op Vital signs reviewed and stable  Post vital signs: Reviewed and stable  Last Vitals:  Vitals Value Taken Time  BP 165/90 01/15/21 1233  Temp    Pulse 64 01/15/21 1237  Resp 14 01/15/21 1237  SpO2 97 % 01/15/21 1237  Vitals shown include unvalidated device data.  Last Pain:  Vitals:   01/15/21 0748  TempSrc: Oral  PainSc:          Complications: No complications documented.

## 2021-01-15 NOTE — Plan of Care (Signed)

## 2021-01-16 ENCOUNTER — Encounter (HOSPITAL_COMMUNITY): Payer: Self-pay | Admitting: Urology

## 2021-01-16 DIAGNOSIS — H409 Unspecified glaucoma: Secondary | ICD-10-CM | POA: Diagnosis not present

## 2021-01-16 DIAGNOSIS — C61 Malignant neoplasm of prostate: Secondary | ICD-10-CM | POA: Diagnosis not present

## 2021-01-16 DIAGNOSIS — Z79899 Other long term (current) drug therapy: Secondary | ICD-10-CM | POA: Diagnosis not present

## 2021-01-16 DIAGNOSIS — E785 Hyperlipidemia, unspecified: Secondary | ICD-10-CM | POA: Diagnosis not present

## 2021-01-16 DIAGNOSIS — I129 Hypertensive chronic kidney disease with stage 1 through stage 4 chronic kidney disease, or unspecified chronic kidney disease: Secondary | ICD-10-CM | POA: Diagnosis not present

## 2021-01-16 DIAGNOSIS — N183 Chronic kidney disease, stage 3 unspecified: Secondary | ICD-10-CM | POA: Diagnosis not present

## 2021-01-16 LAB — BASIC METABOLIC PANEL
Anion gap: 8 (ref 5–15)
BUN: 15 mg/dL (ref 8–23)
CO2: 24 mmol/L (ref 22–32)
Calcium: 8.5 mg/dL — ABNORMAL LOW (ref 8.9–10.3)
Chloride: 104 mmol/L (ref 98–111)
Creatinine, Ser: 1.15 mg/dL (ref 0.61–1.24)
GFR, Estimated: >60 mL/min (ref >60)
Glucose, Bld: 148 mg/dL — ABNORMAL HIGH (ref 70–99)
Potassium: 3.8 mmol/L (ref 3.5–5.1)
Sodium: 136 mmol/L (ref 135–145)

## 2021-01-16 LAB — HEMOGLOBIN AND HEMATOCRIT, BLOOD
HCT: 32 % — ABNORMAL LOW (ref 39.0–52.0)
Hemoglobin: 10.6 g/dL — ABNORMAL LOW (ref 13.0–17.0)

## 2021-01-16 NOTE — Plan of Care (Signed)
  Problem: Clinical Measurements: Goal: Ability to maintain clinical measurements within normal limits will improve Outcome: Adequate for Discharge Goal: Will remain free from infection Outcome: Adequate for Discharge Goal: Diagnostic test results will improve Outcome: Adequate for Discharge Goal: Respiratory complications will improve Outcome: Adequate for Discharge Goal: Cardiovascular complication will be avoided Outcome: Adequate for Discharge   Problem: Activity: Goal: Risk for activity intolerance will decrease Outcome: Adequate for Discharge   Problem: Nutrition: Goal: Adequate nutrition will be maintained Outcome: Adequate for Discharge

## 2021-01-16 NOTE — Discharge Summary (Signed)
Date of admission: 01/15/2021  Date of discharge: 01/16/2021  Admission diagnosis: Prostate cancer  Discharge diagnosis: Prostate cancer   Secondary diagnoses: LUTS  History and Physical: For full details, please see admission history and physical. Briefly, Kevin Gamble is a 75 y.o. male with history high-risk prostate cancer.   Hospital Course:  The patient underwent robotic radical prostatectomy with bilateral lymph node dissection on 01/15/21. A JP drain was placed intraoperatively. He tolerated the procedure well and was transferred to the floor after receiving routine post-operative care. His diet was gradually advanced, and his pain was controlled with oral analgesics.  By POD1, he was tolerating a regular diet, ambulating, having good urine output via foley catheter, and having pain well-controlled with oral analgesics. Thus, he was deemed appropriate for discharge home. His foley catheter was continued on discharge.  His JP drain had minimal output during his hospitalization, so it was removed prior to discharge.   Laboratory values:  Recent Labs    01/15/21 1343 01/16/21 0459  HGB 13.4 10.6*  HCT 39.8 32.0*   Recent Labs    01/16/21 0459  CREATININE 1.15   Physical Exam:  General: Alert and oriented CV: Regular rate Lungs: NWOB on RA Abdomen: Soft, nondistended, appropriately tender. Incisions intact and covered with dermabond; no signs of infection. JP drain removed; site covered with clean, dry dressing  GU: Foley in place draining light pink urine without clots  Extremities: Warm and well-perfused   Disposition: Home  Discharge instruction: The patient was instructed to be ambulatory but told to refrain from heavy lifting, strenuous activity, or driving while on narcotics.   Discharge medications:  Allergies as of 01/16/2021   No Known Allergies     Medication List    STOP taking these medications   Co Q-10 100 MG Caps   dutasteride 0.5 MG  capsule Commonly known as: AVODART   Flaxseed Oil 1400 MG Caps   MULTIVITAMINS PO   silodosin 4 MG Caps capsule Commonly known as: RAPAFLO   solifenacin 5 MG tablet Commonly known as: VESICARE     TAKE these medications   docusate sodium 100 MG capsule Commonly known as: COLACE Take 1 capsule (100 mg total) by mouth 2 (two) times daily.   HYDROcodone-acetaminophen 5-325 MG tablet Commonly known as: Norco Take 1-2 tablets by mouth every 6 (six) hours as needed for moderate pain.   losartan 25 MG tablet Commonly known as: COZAAR Take 25 mg by mouth daily.   sulfamethoxazole-trimethoprim 800-160 MG tablet Commonly known as: BACTRIM DS Take 1 tablet by mouth 2 (two) times daily. Start the day prior to foley removal appointment        Followup:   Follow-up Information    Alexis Frock, MD On 01/27/2021.   Specialty: Urology Why: at 3:15 for MD visit, pathology review, and office catheter removal Contact information: [address] [place] [address] [phone]

## 2021-01-16 NOTE — Progress Notes (Signed)
1 Day Post-Op   Subjective: Overall doing well. Tolerating CLD without nausea/emesis. Has hiccups that come and go. Endorses some abdominal fullness but is hungry this morning. No flatus. Ambulated. Pain controlled.   Objective: Vital signs in last 24 hours: Temp:  [96.2 F (35.7 C)-99.1 F (37.3 C)] 98.6 F (37 C) (03/31 0642) Pulse Rate:  [54-73] 54 (03/31 0642) Resp:  [11-20] 20 (03/31 0642) BP: (109-168)/(66-100) 119/66 (03/31 0642) SpO2:  [96 %-100 %] 99 % (03/31 0642) Weight:  [63 kg] 63 kg (03/30 0728)  Intake/Output from previous day: 03/30 0701 - 03/31 0700 In: 3948.7 [P.O.:360; I.V.:2588.7; IV Piggyback:1000] Out: 2180 [Urine:1750; Drains:130; Blood:300] Intake/Output this shift: No intake/output data recorded.  Physical Exam:  General: Alert and oriented CV: Regular rate Lungs: NWOB on RA Abdomen: Soft, mildly distended, appropriately tender. Incisions intact and covered with dermabond; no signs of infection. LLQ JP drain with minimal SS output  GU: Foley in place draining light pink urine without clots  Extremities: Warm and well-perfused   Lab Results: Recent Labs    01/15/21 1343 01/16/21 0459  HGB 13.4 10.6*  HCT 39.8 32.0*      Assessment/Plan: POD# 1 s/p robotic prostatectomy and bilateral pelvic lymph node dissection. Overall doing well.   - Advance diet as tolerated to regular. Stop IVF. Bowel regimen - IS, encourage ambulation, SCDs - Transition to oral analgesics  - Trend JP output. Anticipate removal prior to discharge - Continue foley catheter to drainage on discharge - EDD later today     LOS: 0 days   Celene Squibb 01/16/2021, 7:04 AM

## 2021-01-22 LAB — SURGICAL PATHOLOGY

## 2021-02-04 ENCOUNTER — Telehealth: Payer: Self-pay | Admitting: *Deleted

## 2021-02-04 DIAGNOSIS — M6281 Muscle weakness (generalized): Secondary | ICD-10-CM | POA: Diagnosis not present

## 2021-02-04 DIAGNOSIS — N393 Stress incontinence (female) (male): Secondary | ICD-10-CM | POA: Diagnosis not present

## 2021-02-04 DIAGNOSIS — M6289 Other specified disorders of muscle: Secondary | ICD-10-CM | POA: Diagnosis not present

## 2021-02-04 DIAGNOSIS — M62838 Other muscle spasm: Secondary | ICD-10-CM | POA: Diagnosis not present

## 2021-02-04 NOTE — Telephone Encounter (Signed)
PATIENT OPTED TO HAVE PROSTATECOMY PER [facility]

## 2021-02-11 DIAGNOSIS — M6281 Muscle weakness (generalized): Secondary | ICD-10-CM | POA: Diagnosis not present

## 2021-02-11 DIAGNOSIS — N393 Stress incontinence (female) (male): Secondary | ICD-10-CM | POA: Diagnosis not present

## 2021-02-11 DIAGNOSIS — M62838 Other muscle spasm: Secondary | ICD-10-CM | POA: Diagnosis not present

## 2021-02-26 DIAGNOSIS — M6281 Muscle weakness (generalized): Secondary | ICD-10-CM | POA: Diagnosis not present

## 2021-02-26 DIAGNOSIS — N393 Stress incontinence (female) (male): Secondary | ICD-10-CM | POA: Diagnosis not present

## 2021-02-26 DIAGNOSIS — M62838 Other muscle spasm: Secondary | ICD-10-CM | POA: Diagnosis not present

## 2021-03-12 DIAGNOSIS — N393 Stress incontinence (female) (male): Secondary | ICD-10-CM | POA: Diagnosis not present

## 2021-03-12 DIAGNOSIS — M62838 Other muscle spasm: Secondary | ICD-10-CM | POA: Diagnosis not present

## 2021-03-12 DIAGNOSIS — M6281 Muscle weakness (generalized): Secondary | ICD-10-CM | POA: Diagnosis not present

## 2021-03-19 DIAGNOSIS — Z125 Encounter for screening for malignant neoplasm of prostate: Secondary | ICD-10-CM | POA: Diagnosis not present

## 2021-03-19 DIAGNOSIS — I10 Essential (primary) hypertension: Secondary | ICD-10-CM | POA: Diagnosis not present

## 2021-03-26 DIAGNOSIS — E7800 Pure hypercholesterolemia, unspecified: Secondary | ICD-10-CM | POA: Diagnosis not present

## 2021-03-26 DIAGNOSIS — Z0001 Encounter for general adult medical examination with abnormal findings: Secondary | ICD-10-CM | POA: Diagnosis not present

## 2021-03-26 DIAGNOSIS — Z0000 Encounter for general adult medical examination without abnormal findings: Secondary | ICD-10-CM | POA: Diagnosis not present

## 2021-03-26 DIAGNOSIS — I10 Essential (primary) hypertension: Secondary | ICD-10-CM | POA: Diagnosis not present

## 2021-03-27 ENCOUNTER — Other Ambulatory Visit: Payer: Self-pay | Admitting: Internal Medicine

## 2021-03-27 DIAGNOSIS — E7800 Pure hypercholesterolemia, unspecified: Secondary | ICD-10-CM

## 2021-04-17 ENCOUNTER — Ambulatory Visit
Admission: RE | Admit: 2021-04-17 | Discharge: 2021-04-17 | Disposition: A | Discharge status: Home / Self Care | Payer: Medicare PPO | Source: Ambulatory Visit | Attending: Internal Medicine | Admitting: Internal Medicine

## 2021-04-17 DIAGNOSIS — E7800 Pure hypercholesterolemia, unspecified: Secondary | ICD-10-CM

## 2021-04-18 DIAGNOSIS — N393 Stress incontinence (female) (male): Secondary | ICD-10-CM | POA: Diagnosis not present

## 2021-04-18 DIAGNOSIS — M6281 Muscle weakness (generalized): Secondary | ICD-10-CM | POA: Diagnosis not present

## 2021-04-18 DIAGNOSIS — M62838 Other muscle spasm: Secondary | ICD-10-CM | POA: Diagnosis not present

## 2021-04-29 DIAGNOSIS — C61 Malignant neoplasm of prostate: Secondary | ICD-10-CM | POA: Diagnosis not present

## 2021-05-13 DIAGNOSIS — J3089 Other allergic rhinitis: Secondary | ICD-10-CM | POA: Diagnosis not present

## 2021-05-13 DIAGNOSIS — E7800 Pure hypercholesterolemia, unspecified: Secondary | ICD-10-CM | POA: Diagnosis not present

## 2021-05-13 DIAGNOSIS — J8410 Pulmonary fibrosis, unspecified: Secondary | ICD-10-CM | POA: Diagnosis not present

## 2021-05-15 DIAGNOSIS — M6281 Muscle weakness (generalized): Secondary | ICD-10-CM | POA: Diagnosis not present

## 2021-05-15 DIAGNOSIS — N5201 Erectile dysfunction due to arterial insufficiency: Secondary | ICD-10-CM | POA: Diagnosis not present

## 2021-05-15 DIAGNOSIS — M62838 Other muscle spasm: Secondary | ICD-10-CM | POA: Diagnosis not present

## 2021-05-15 DIAGNOSIS — N393 Stress incontinence (female) (male): Secondary | ICD-10-CM | POA: Diagnosis not present

## 2021-05-15 DIAGNOSIS — C61 Malignant neoplasm of prostate: Secondary | ICD-10-CM | POA: Diagnosis not present

## 2021-05-28 ENCOUNTER — Other Ambulatory Visit: Payer: Self-pay

## 2021-05-28 ENCOUNTER — Ambulatory Visit: Payer: Medicare PPO | Admitting: Infectious Disease

## 2021-05-28 ENCOUNTER — Encounter: Payer: Self-pay | Admitting: Infectious Disease

## 2021-05-28 VITALS — BP 168/82 | HR 58 | Temp 97.9°F | Resp 16 | Ht 68.0 in | Wt 139.0 lb

## 2021-05-28 DIAGNOSIS — R7612 Nonspecific reaction to cell mediated immunity measurement of gamma interferon antigen response without active tuberculosis: Secondary | ICD-10-CM | POA: Diagnosis not present

## 2021-05-28 DIAGNOSIS — J8410 Pulmonary fibrosis, unspecified: Secondary | ICD-10-CM

## 2021-05-28 DIAGNOSIS — J309 Allergic rhinitis, unspecified: Secondary | ICD-10-CM | POA: Insufficient documentation

## 2021-05-28 DIAGNOSIS — C61 Malignant neoplasm of prostate: Secondary | ICD-10-CM

## 2021-05-28 DIAGNOSIS — Z114 Encounter for screening for human immunodeficiency virus [HIV]: Secondary | ICD-10-CM

## 2021-05-28 DIAGNOSIS — Z227 Latent tuberculosis: Secondary | ICD-10-CM | POA: Diagnosis not present

## 2021-05-28 DIAGNOSIS — E785 Hyperlipidemia, unspecified: Secondary | ICD-10-CM | POA: Diagnosis not present

## 2021-05-28 DIAGNOSIS — I10 Essential (primary) hypertension: Secondary | ICD-10-CM

## 2021-05-28 DIAGNOSIS — Z1159 Encounter for screening for other viral diseases: Secondary | ICD-10-CM

## 2021-05-28 DIAGNOSIS — N1831 Chronic kidney disease, stage 3a: Secondary | ICD-10-CM

## 2021-05-28 DIAGNOSIS — J984 Other disorders of lung: Secondary | ICD-10-CM

## 2021-05-28 HISTORY — DX: Nonspecific reaction to cell mediated immunity measurement of gamma interferon antigen response without active tuberculosis: R76.12

## 2021-05-28 HISTORY — DX: Encounter for screening for other viral diseases: Z11.59

## 2021-05-28 MED ORDER — ISONIAZID 300 MG PO TABS: 300.0000 mg | ORAL_TABLET | Freq: Every day | ORAL | 2 refills | Status: DC

## 2021-05-28 MED ORDER — VITAMIN B-6 50 MG PO TABS: 50.0000 mg | ORAL_TABLET | Freq: Every day | ORAL | 2 refills | Status: DC

## 2021-05-28 MED ORDER — RIFAMPIN 300 MG PO CAPS: 600.0000 mg | ORAL_CAPSULE | Freq: Every day | ORAL | 2 refills | Status: DC

## 2021-05-28 NOTE — Progress Notes (Signed)
Subjective:  Reason for infectious disease consultation: Latent tuberculosis  Requesting physician: Deland Pretty, MD     Patient ID: Kevin Gamble, male    DOB: 28-Dec-1945, 75 y.o.   MRN: [id number]  HPI  Kevin Gamble is a very pleasant 75 year old [place] born man who states his actual age is likely more like 19 (his age was arbitrarily decided when he left [place]), who does have comorbid hypertension hyperlipidemia and recent prostate cancer status post surgery with apparent cure of his prostate cancer.  He had a chest x-ray which had shown a calcified granuloma.  This prompted his primary care physician to check a QuantiFERON gold which was positive.  He himself does not recall being exposed to a specific person who had tuberculosis.  He does remember his mother suffering from whooping cough when he was a small child.  He does not have any cough he has no fevers chills involuntary weight loss or systemic symptoms to suggest infection.  He did lose some weight onset of his prostate cancer but he has gained it back.    Past Medical History:  Diagnosis Date   BPH (benign prostatic hyperplasia) 2015   With increased urinary frequency --> Dr. Pilar Jarvis (Urology) - elevated PSA - Bx Neg    Chronic kidney disease    stage 3 kidney disease    Essential hypertension    Glaucoma    Dr. Katy Fitch   Hyperlipidemia due to dietary fat intake    Managed with diet control   Internal hemorrhoids    Prostate cancer [facility])     Past Surgical History:  Procedure Laterality Date   COLONOSCOPY  2019   EYE SURGERY Bilateral 2010   LYMPHADENECTOMY Bilateral 01/15/2021   Procedure: LYMPHADENECTOMY;  Surgeon: Alexis Frock, MD;  Location: [facility];  Service: Urology;  Laterality: Bilateral;   PROSTATE BIOPSY  2015, 2016   Dr. Vance Gather   ROBOT ASSISTED LAPAROSCOPIC RADICAL PROSTATECTOMY N/A 01/15/2021   Procedure: XI ROBOTIC ASSISTED LAPAROSCOPIC RADICAL PROSTATECTOMY WITH INDOCYANINE GREEN DYE INJECTION;   Surgeon: Alexis Frock, MD;  Location: [facility];  Service: Urology;  Laterality: N/A;  3 HRS    Family History  Problem Relation Age of Onset   Other Mother        Unknonwn cause/history - died when pt was young   Other Father        Unknonwn cause/history - died when pt was young   Healthy Sister    Hypertension Brother    Colon cancer Neg Hx    Esophageal cancer Neg Hx    Rectal cancer Neg Hx    Stomach cancer Neg Hx       Social History   Socioeconomic History   Marital status: Married    Spouse name: Not on file   Number of children: 4   Years of education: Not on file   Highest education level: Not on file  Occupational History   Occupation: professor    Employer: [employer]    Comment: retired  Tobacco Use   Smoking status: Never   Smokeless tobacco: Never  Scientific laboratory technician Use: Never used  Substance and Sexual Activity   Alcohol use: Never   Drug use: Never   Sexual activity: Not Currently  Other Topics Concern   Not on file  Social History Narrative   Is a married father of 4 sons.    His listed birthday is in 1942, but he thinks that actually his birthday  was probably in 1939 instead.   He usually walks every day about 2 to 1/62mles at a relatively brisk pace.   Social Determinants of Health   Financial Resource Strain: Not on file  Food Insecurity: Not on file  Transportation Needs: Not on file  Physical Activity: Not on file  Stress: Not on file  Social Connections: Not on file    No Known Allergies   Current Outpatient Medications:    docusate sodium (COLACE) 100 MG capsule, Take 1 capsule (100 mg total) by mouth 2 (two) times daily., Disp: , Rfl:    HYDROcodone-acetaminophen (NORCO) 5-325 MG tablet, Take 1-2 tablets by mouth every 6 (six) hours as needed for moderate pain., Disp: 20 tablet, Rfl: 0   losartan (COZAAR) 25 MG tablet, Take 25 mg by mouth daily., Disp: , Rfl:    sulfamethoxazole-trimethoprim (BACTRIM DS) 800-160 MG tablet,  Take 1 tablet by mouth 2 (two) times daily. Start the day prior to foley removal appointment, Disp: 6 tablet, Rfl: 0   Review of Systems  Constitutional:  Negative for activity change, appetite change, chills, diaphoresis, fatigue, fever and unexpected weight change.  HENT:  Negative for congestion, rhinorrhea, sinus pressure, sneezing, sore throat and trouble swallowing.   Eyes:  Negative for photophobia and visual disturbance.  Respiratory:  Negative for cough, chest tightness, shortness of breath, wheezing and stridor.   Cardiovascular:  Negative for chest pain, palpitations and leg swelling.  Gastrointestinal:  Negative for abdominal distention, abdominal pain, anal bleeding, blood in stool, constipation, diarrhea, nausea and vomiting.  Genitourinary:  Negative for difficulty urinating, dysuria, flank pain and hematuria.  Musculoskeletal:  Negative for arthralgias, back pain, gait problem, joint swelling and myalgias.  Skin:  Negative for color change, pallor, rash and wound.  Neurological:  Negative for dizziness, tremors, weakness and light-headedness.  Hematological:  Negative for adenopathy. Does not bruise/bleed easily.  Psychiatric/Behavioral:  Negative for agitation, behavioral problems, confusion, decreased concentration, dysphoric mood and sleep disturbance.       Objective:   Physical Exam Constitutional:      Appearance: He is well-developed.  HENT:     Head: Normocephalic and atraumatic.  Eyes:     General:        Right eye: No discharge.        Left eye: No discharge.     Conjunctiva/sclera: Conjunctivae normal.  Cardiovascular:     Rate and Rhythm: Normal rate and regular rhythm.     Heart sounds: No murmur heard.   No friction rub. No gallop.  Pulmonary:     Effort: Pulmonary effort is normal. No respiratory distress.     Breath sounds: Normal breath sounds. No stridor. No wheezing or rhonchi.  Chest:     Chest wall: No tenderness.  Abdominal:     General:  Abdomen is flat. Bowel sounds are normal. There is no distension.     Palpations: Abdomen is soft.  Musculoskeletal:        General: No tenderness. Normal range of motion.     Cervical back: Normal range of motion and neck supple.  Skin:    General: Skin is warm and dry.     Coloration: Skin is not pale.     Findings: No erythema or rash.  Neurological:     General: No focal deficit present.     Mental Status: He is alert and oriented to person, place, and time.  Psychiatric:        Mood and Affect: Mood  normal.        Behavior: Behavior normal.        Thought Content: Thought content normal.        Judgment: Judgment normal.          Assessment & Plan:   Latent tuberculosis:  Per protocol repeat is QuantiFERON gold but if it is negative I still intend to treat him given the calcified granuloma seen on chest x-ray and the 1 positive test.  His pretest likelihood of having latent TB is exceedingly high.  CMP  was normal at his PCPs office bowel repeated today again and we will check his liver function test on therapy.  Will opt for rifampin 600 mg once daily along with isoniazid 300 mg daily and vitamin B6 50 mg daily x3 months.  I will also check an HIV antibody  Screening for HIV hepatitis B and C: We will screen for hepatitis B in addition to HIV and hepatitis C screening will be done as well.   Prostate cancer: now cured  Hyperlipidemia: not on medications  I spent 81 minutes with the patient including than 50% of the time in face to face counseling of the patient guarding the nature of latent tuberculosis active tuberculosis, the nature of the medications were giving him potential side effects adverse effects warning signs I would monitor therapy, personally reviewing his CT of the chest done in June which showed multiple ossified granulomas, reviewing his CMP CBC with differential and PSA from his primary care doctor's office, along with quantiferon gold reviewing  medical records before and during the visit and in coordination of his care.

## 2021-06-03 LAB — QUANTIFERON-TB GOLD PLUS
Mitogen-NIL: >10 IU/mL
NIL: 0.18 IU/mL
QuantiFERON-TB Gold Plus: POSITIVE — AB
TB1-NIL: 1.82 IU/mL
TB2-NIL: 1.84 IU/mL

## 2021-06-03 LAB — HEPATITIS B SURFACE ANTIGEN: Hepatitis B Surface Ag: NONREACTIVE

## 2021-06-03 LAB — COMPLETE METABOLIC PANEL WITH GFR
AG Ratio: 1.5 (calc) (ref 1.0–2.5)
ALT: 15 U/L (ref 9–46)
AST: 20 U/L (ref 10–35)
Albumin: 4.3 g/dL (ref 3.6–5.1)
Alkaline phosphatase (APISO): 92 U/L (ref 35–144)
BUN: 15 mg/dL (ref 7–25)
CO2: 29 mmol/L (ref 20–32)
Calcium: 9.6 mg/dL (ref 8.6–10.3)
Chloride: 105 mmol/L (ref 98–110)
Creat: 1.07 mg/dL (ref 0.70–1.28)
Globulin: 2.8 g/dL (calc) (ref 1.9–3.7)
Glucose, Bld: 86 mg/dL (ref 65–99)
Potassium: 3.7 mmol/L (ref 3.5–5.3)
Sodium: 143 mmol/L (ref 135–146)
Total Bilirubin: 0.6 mg/dL (ref 0.2–1.2)
Total Protein: 7.1 g/dL (ref 6.1–8.1)
eGFR: 73 mL/min/{1.73_m2} (ref >=60)

## 2021-06-03 LAB — HIV ANTIBODY (ROUTINE TESTING W REFLEX): HIV 1&2 Ab, 4th Generation: NONREACTIVE

## 2021-06-03 LAB — HEPATITIS C ANTIBODY
Hepatitis C Ab: NONREACTIVE
SIGNAL TO CUT-OFF: 0.01 (ref <1.00)

## 2021-06-03 LAB — HEPATITIS B SURFACE ANTIBODY, QUANTITATIVE: Hep B S AB Quant (Post): 51 m[IU]/mL (ref >=10)

## 2021-06-04 DIAGNOSIS — N393 Stress incontinence (female) (male): Secondary | ICD-10-CM | POA: Diagnosis not present

## 2021-06-04 DIAGNOSIS — M62838 Other muscle spasm: Secondary | ICD-10-CM | POA: Diagnosis not present

## 2021-06-04 DIAGNOSIS — M6281 Muscle weakness (generalized): Secondary | ICD-10-CM | POA: Diagnosis not present

## 2021-06-09 DIAGNOSIS — Z8546 Personal history of malignant neoplasm of prostate: Secondary | ICD-10-CM | POA: Diagnosis not present

## 2021-06-09 DIAGNOSIS — R32 Unspecified urinary incontinence: Secondary | ICD-10-CM | POA: Diagnosis not present

## 2021-06-09 DIAGNOSIS — N529 Male erectile dysfunction, unspecified: Secondary | ICD-10-CM | POA: Diagnosis not present

## 2021-06-09 DIAGNOSIS — I10 Essential (primary) hypertension: Secondary | ICD-10-CM | POA: Diagnosis not present

## 2021-07-01 ENCOUNTER — Other Ambulatory Visit: Payer: Self-pay

## 2021-07-01 ENCOUNTER — Encounter: Payer: Self-pay | Admitting: Infectious Disease

## 2021-07-01 ENCOUNTER — Ambulatory Visit: Payer: Medicare PPO | Admitting: Infectious Disease

## 2021-07-01 VITALS — BP 155/85 | HR 55 | Resp 16 | Ht 68.0 in | Wt 139.0 lb

## 2021-07-01 DIAGNOSIS — C61 Malignant neoplasm of prostate: Secondary | ICD-10-CM | POA: Diagnosis not present

## 2021-07-01 DIAGNOSIS — N393 Stress incontinence (female) (male): Secondary | ICD-10-CM | POA: Diagnosis not present

## 2021-07-01 DIAGNOSIS — M62838 Other muscle spasm: Secondary | ICD-10-CM | POA: Diagnosis not present

## 2021-07-01 DIAGNOSIS — Z227 Latent tuberculosis: Secondary | ICD-10-CM | POA: Diagnosis not present

## 2021-07-01 DIAGNOSIS — E785 Hyperlipidemia, unspecified: Secondary | ICD-10-CM

## 2021-07-01 DIAGNOSIS — I10 Essential (primary) hypertension: Secondary | ICD-10-CM

## 2021-07-01 DIAGNOSIS — M6281 Muscle weakness (generalized): Secondary | ICD-10-CM | POA: Diagnosis not present

## 2021-07-01 DIAGNOSIS — N1831 Chronic kidney disease, stage 3a: Secondary | ICD-10-CM

## 2021-07-01 DIAGNOSIS — J8410 Pulmonary fibrosis, unspecified: Secondary | ICD-10-CM

## 2021-07-01 HISTORY — DX: Latent tuberculosis: Z22.7

## 2021-07-01 NOTE — Progress Notes (Signed)
Subjective:  Complaint follow-up for latent tuberculosis noticing that his urine is turned orange as well as his stools turned a darker more orange like color   Patient ID: Kevin Gamble, male    DOB: May 24, 1946, 75 y.o.   MRN: [id number]  HPI  Kevin Gamble is a very pleasant 75 year old [place] born man who states his actual age is likely more like 46 (his age was arbitrarily decided when he left [place]), who does have comorbid hypertension hyperlipidemia and recent prostate cancer status post surgery with apparent cure of his prostate cancer.  He had a chest x-ray which had shown a calcified granuloma.  This prompted his primary care physician to check a QuantiFERON gold which was positive.  He himself does not recall being exposed to a specific person who had tuberculosis.  He does remember his mother suffering from whooping cough when he was a small child.  He does not have any cough he has no fevers chills involuntary weight loss or systemic symptoms to suggest infection.  He did lose some weight onset of his prostate cancer but he has gained it back.  Is QuantiFERON gold was positive again when we saw him in the clinic.  His HIV test was negative hepatitis B and hepatitis C serologies were negative.  We initiated him on rifampin and isoniazid daily and he has gotten through a month of therapy without any difficulties.  He does notice that his urine is turned orange and his stool also has more of an orange color.  He is not experiencing nausea or abdominal discomfort.      Past Medical History:  Diagnosis Date   BPH (benign prostatic hyperplasia) 2015   With increased urinary frequency --> Dr. Pilar Jarvis (Urology) - elevated PSA - Bx Neg    Chronic kidney disease    stage 3 kidney disease    Essential hypertension    Glaucoma    Dr. Katy Fitch   Hyperlipidemia due to dietary fat intake    Managed with diet control   Internal hemorrhoids    Need for hepatitis B screening test  05/28/2021   Need for hepatitis C screening test 05/28/2021   Positive QuantiFERON-TB Gold test 05/28/2021   Prostate cancer [facility])     Past Surgical History:  Procedure Laterality Date   COLONOSCOPY  2019   EYE SURGERY Bilateral 2010   LYMPHADENECTOMY Bilateral 01/15/2021   Procedure: LYMPHADENECTOMY;  Surgeon: Alexis Frock, MD;  [facility];  Service: Urology;  Laterality: Bilateral;   PROSTATE BIOPSY  2015, 2016   Dr. Vance Gather   ROBOT ASSISTED LAPAROSCOPIC RADICAL PROSTATECTOMY N/A 01/15/2021   Procedure: XI ROBOTIC ASSISTED LAPAROSCOPIC RADICAL PROSTATECTOMY WITH INDOCYANINE GREEN DYE INJECTION;  Surgeon: Alexis Frock, MD;  Location: [facility];  Service: Urology;  Laterality: N/A;  3 HRS    Family History  Problem Relation Age of Onset   Other Mother        Unknonwn cause/history - died when pt was young   Other Father        Unknonwn cause/history - died when pt was young   Healthy Sister    Hypertension Brother    Colon cancer Neg Hx    Esophageal cancer Neg Hx    Rectal cancer Neg Hx    Stomach cancer Neg Hx       Social History   Socioeconomic History   Marital status: Married    Spouse name: Not on file   Number of children: 4  Years of education: Not on file   Highest education level: Not on file  Occupational History   Occupation: professor    Employer: [employer]    Comment: retired  Tobacco Use   Smoking status: Never   Smokeless tobacco: Never  Vaping Use   Vaping Use: Never used  Substance and Sexual Activity   Alcohol use: Never   Drug use: Never   Sexual activity: Not Currently  Other Topics Concern   Not on file  Social History Narrative   Is a married father of 4 sons.    His listed birthday is in [birth year], but he thinks that actually his birthday was probably in [birth year] instead.   He usually walks every day about 2 to 1/64mles at a relatively brisk pace.   Social Determinants of Health   Financial Resource Strain: Not on file  Food  Insecurity: Not on file  Transportation Needs: Not on file  Physical Activity: Not on file  Stress: Not on file  Social Connections: Not on file    No Known Allergies   Current Outpatient Medications:    isoniazid (NYDRAZID) 300 MG tablet, Take 1 tablet (300 mg total) by mouth daily., Disp: 30 tablet, Rfl: 2   losartan (COZAAR) 25 MG tablet, Take 25 mg by mouth daily., Disp: , Rfl:    Multiple Vitamins-Minerals (MULTI COMPLETE PO), Take by mouth., Disp: , Rfl:    pyridOXINE (VITAMIN B-6) 50 MG tablet, Take 1 tablet (50 mg total) by mouth daily., Disp: 30 tablet, Rfl: 2   rifampin (RIFADIN) 300 MG capsule, Take 2 capsules (600 mg total) by mouth daily., Disp: 60 capsule, Rfl: 2   Review of Systems     Objective:   Physical Exam Constitutional:      Appearance: He is well-developed.  HENT:     Head: Normocephalic and atraumatic.  Eyes:     Conjunctiva/sclera: Conjunctivae normal.  Cardiovascular:     Rate and Rhythm: Normal rate and regular rhythm.  Pulmonary:     Effort: Pulmonary effort is normal. No respiratory distress.     Breath sounds: No wheezing.  Abdominal:     General: There is no distension.     Palpations: Abdomen is soft.  Musculoskeletal:        General: No tenderness. Normal range of motion.     Cervical back: Normal range of motion and neck supple.  Skin:    General: Skin is warm and dry.     Coloration: Skin is not pale.     Findings: No erythema or rash.  Neurological:     General: No focal deficit present.     Mental Status: He is alert and oriented to person, place, and time.  Psychiatric:        Mood and Affect: Mood normal.        Behavior: Behavior normal.        Thought Content: Thought content normal.        Judgment: Judgment normal.          Assessment & Plan:   Latent tuberculosis:  Continue with isoniazid and rifampin to complete 3 months of therapy were checking a CMP today he will come back in 2 months  State cancer:  Cured  Hyperlipidemia on statin  Hypertension: may have degree of white coat HTN, BP not optimal in our clinic  Vitals:   07/01/21 0907  BP: (!) 155/85  Pulse: (!) 55  Resp: 16  SpO2: 96%  CKD: rechecking BMP today with GFR

## 2021-07-02 LAB — COMPLETE METABOLIC PANEL WITH GFR
AG Ratio: 1.5 (calc) (ref 1.0–2.5)
ALT: 20 U/L (ref 9–46)
AST: 23 U/L (ref 10–35)
Albumin: 4.1 g/dL (ref 3.6–5.1)
Alkaline phosphatase (APISO): 83 U/L (ref 35–144)
BUN: 13 mg/dL (ref 7–25)
CO2: 28 mmol/L (ref 20–32)
Calcium: 9.6 mg/dL (ref 8.6–10.3)
Chloride: 105 mmol/L (ref 98–110)
Creat: 1.11 mg/dL (ref 0.70–1.28)
Globulin: 2.8 g/dL (calc) (ref 1.9–3.7)
Glucose, Bld: 86 mg/dL (ref 65–99)
Potassium: 4.1 mmol/L (ref 3.5–5.3)
Sodium: 142 mmol/L (ref 135–146)
Total Bilirubin: 0.4 mg/dL (ref 0.2–1.2)
Total Protein: 6.9 g/dL (ref 6.1–8.1)
eGFR: 70 mL/min/{1.73_m2} (ref >=60)

## 2021-07-08 ENCOUNTER — Telehealth: Payer: Self-pay

## 2021-07-08 NOTE — Telephone Encounter (Signed)
He should receive rifampin and isoniazid for a total of 3 months. He started in August, so he should need 2 more refills to complete therapy. Let me know if you need anything else!

## 2021-07-08 NOTE — Telephone Encounter (Signed)
Received fax from pharmacy about refills of patient's Isoniziad. Patient is also taking Rifampin however office has not received refill request for this. Forwarding to pharmacy team and provider to confirm regimen AND determine how many additional refills need to be provided.   Yeira Gulden Lorita Officer, RN

## 2021-07-09 ENCOUNTER — Other Ambulatory Visit: Payer: Self-pay

## 2021-07-09 DIAGNOSIS — Z227 Latent tuberculosis: Secondary | ICD-10-CM

## 2021-07-09 MED ORDER — RIFAMPIN 300 MG PO CAPS: 600.0000 mg | ORAL_CAPSULE | Freq: Every day | ORAL | 2 refills | Status: DC

## 2021-07-09 MED ORDER — ISONIAZID 300 MG PO TABS: 300.0000 mg | ORAL_TABLET | Freq: Every day | ORAL | 2 refills | Status: DC

## 2021-07-09 MED ORDER — VITAMIN B-6 50 MG PO TABS: 50.0000 mg | ORAL_TABLET | Freq: Every day | ORAL | 2 refills | Status: DC

## 2021-08-02 IMAGING — CT CT CARDIAC CORONARY ARTERY CALCIUM SCORE
3 series · 14 of 20 positions shown, 16 images · non-contrast
Comparison: None.

CLINICAL DATA: 74-year-old African American male with history of
hyperlipidemia.

EXAM:
CT CARDIAC CORONARY ARTERY CALCIUM SCORE
TECHNIQUE: Non-contrast imaging through the heart was performed using
prospective ECG gating. Image post processing was performed on an
independent workstation, allowing for quantitative analysis of the
heart and coronary arteries. Note that this exam targets the heart
and the chest was not imaged in its entirety.

[Series 2: calcium scoring 2.00 qr36 bestdiast 70% hrt calciu · axial · 0.32mm/px · z∈[+1827,+1893]mm · 4 of 57 slices shown]
[im 12/57  vessel]
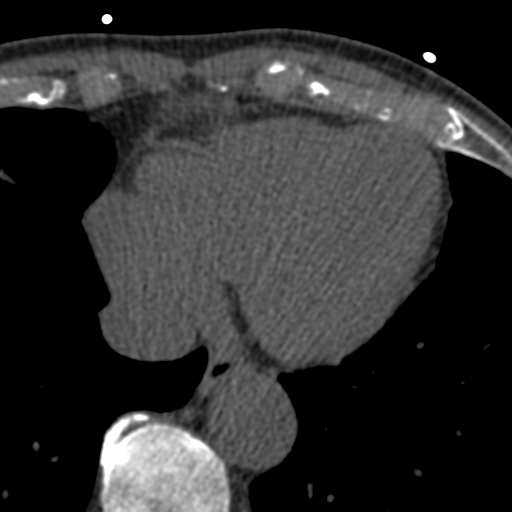
[im 23/57  vessel]
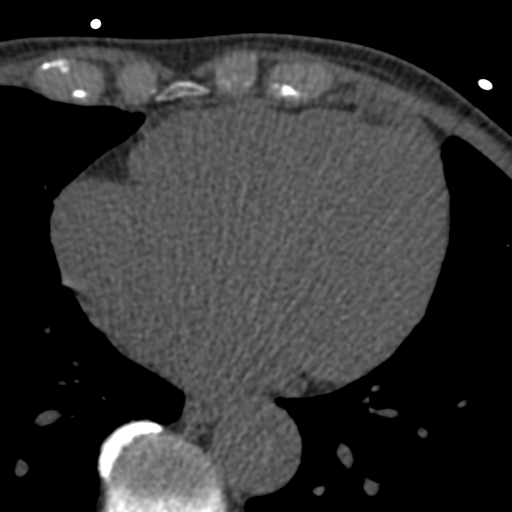
[im 34/57  vessel]
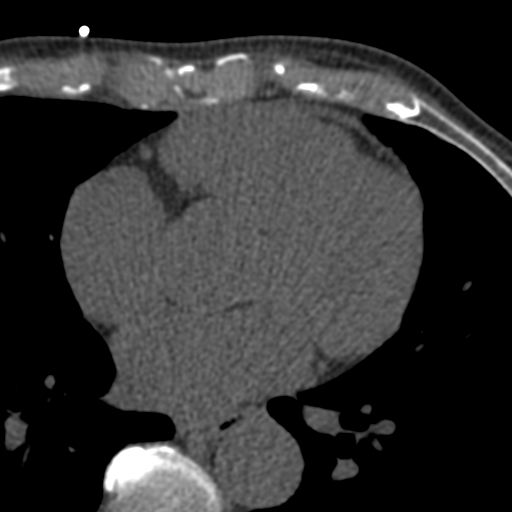
[im 45/57  vessel]
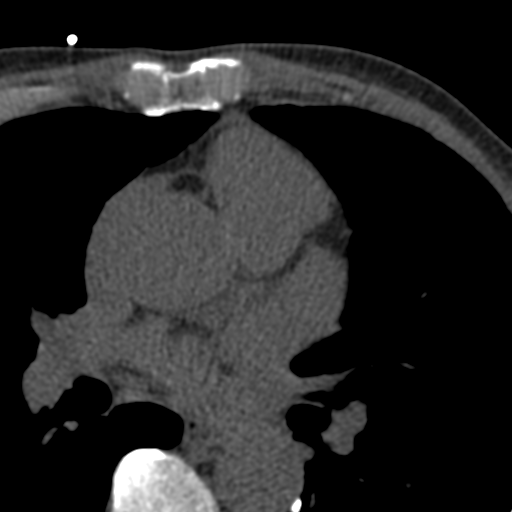

[Series 3: calcium scoring 2.00 br40 bestdiast 70% axial · axial · 0.47mm/px · z∈[+1823,+1897]mm · 5 of 57 slices shown, 7 images]
[im 10/57  vessel]
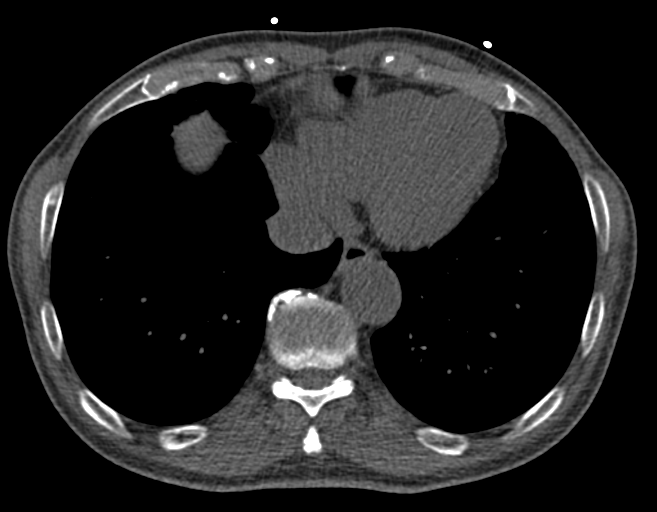
[im 10/57  lung]
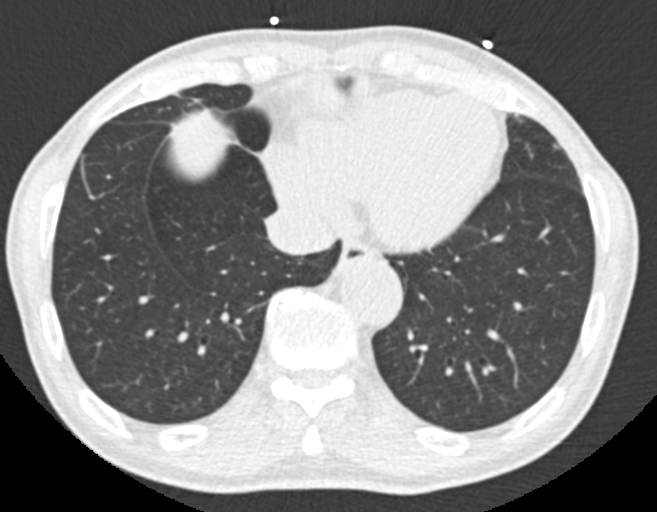
[im 19/57  vessel]
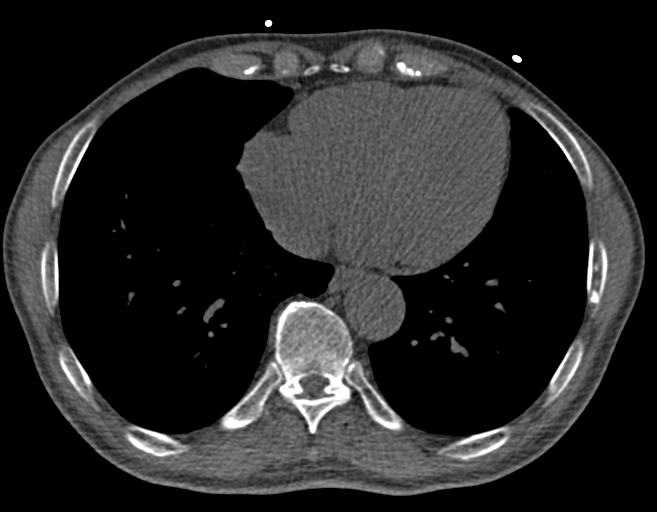
[im 29/57  vessel]
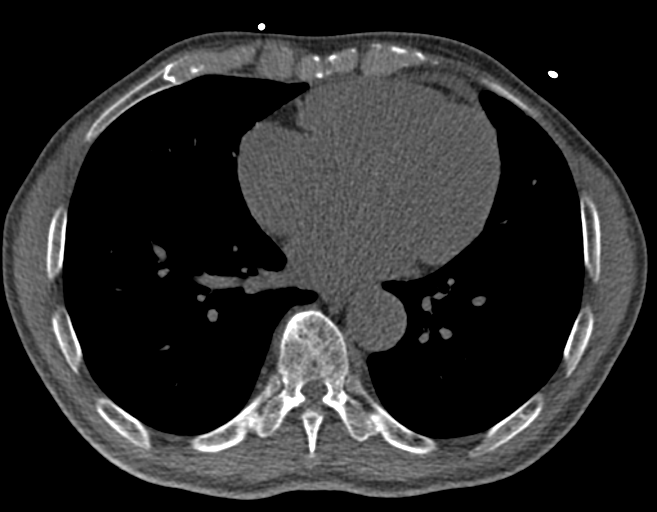
[im 38/57  vessel]
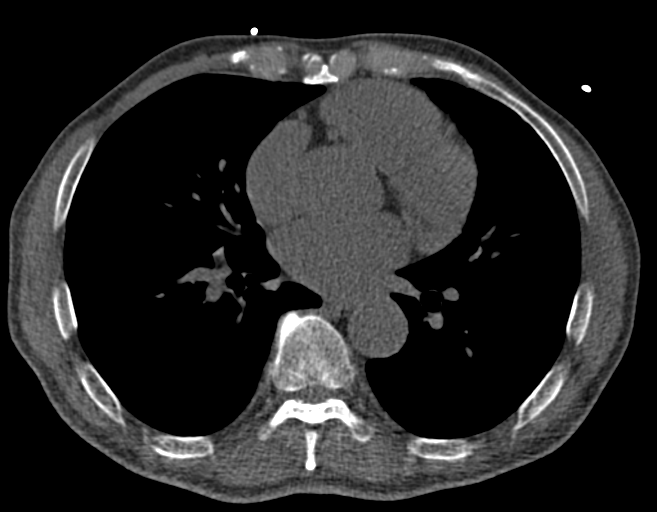
[im 47/57  vessel]
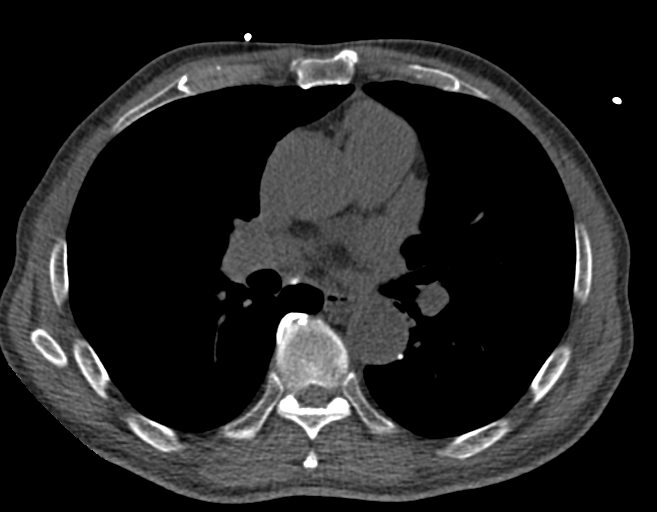
[im 47/57  lung]
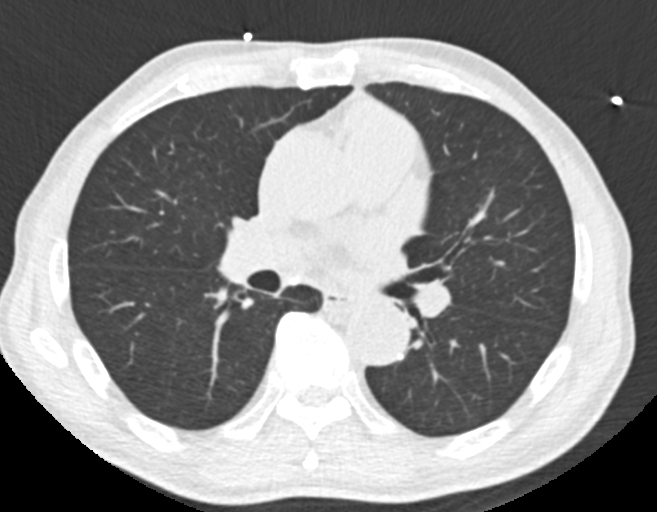

[Series 9: calcium scoring 2.00 br60 bestdiast 70% lungs · axial · 0.47mm/px · z∈[+1823,+1897]mm · 5 of 57 slices shown]
[im 10/57  vessel]
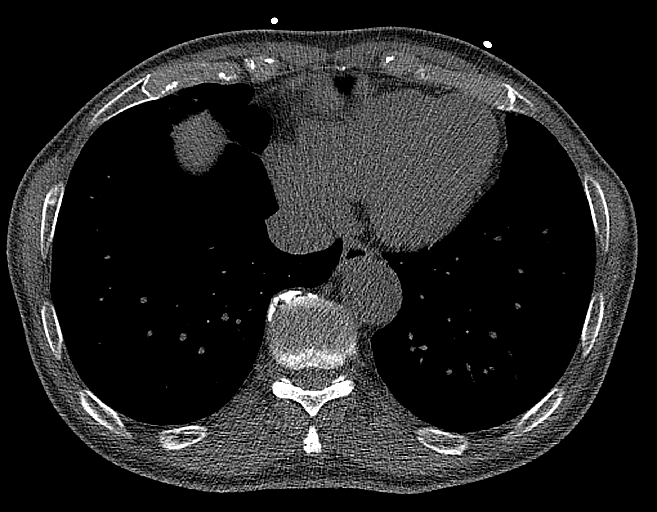
[im 19/57  vessel]
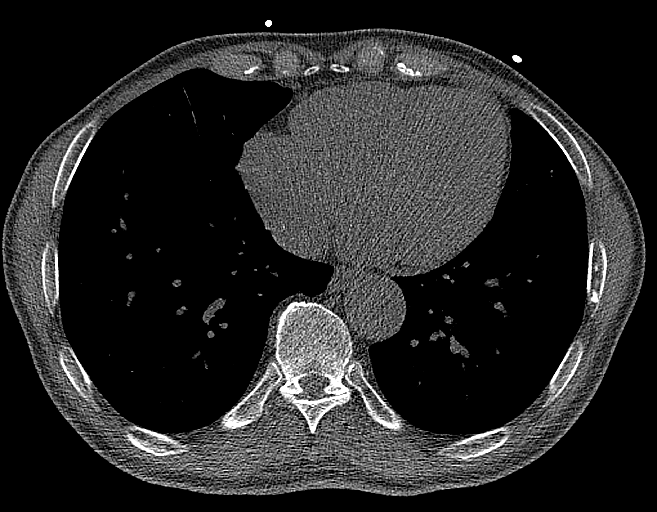
[im 29/57  vessel]
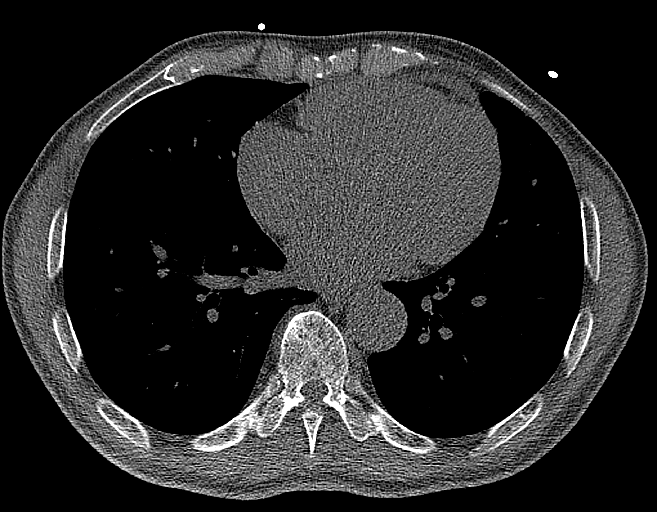
[im 38/57  vessel]
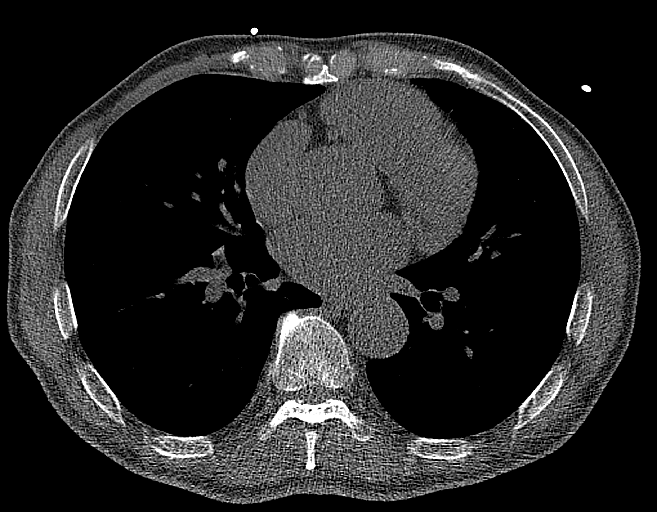
[im 47/57  vessel]
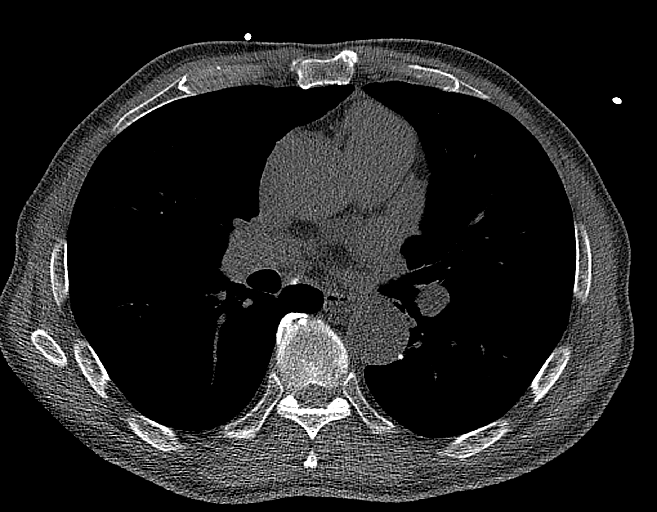

[14 of 20 positions shown; findings below may reference images not displayed]

FINDINGS: CORONARY CALCIUM SCORES:

Left Main: 0

LAD:

LCx: 0

RCA: 0

Total Agatston Score:

[HOSPITAL] percentile: 28

AORTA MEASUREMENTS:

Ascending Aorta: 38 mm

Descending Aorta: 28 mm

OTHER FINDINGS:

The heart size is within normal limits. No pericardial fluid
identified. Visualized segments of the thoracic aorta and central
pulmonary arteries are of normal caliber. Visualized mediastinum and
hilar regions demonstrate no lymphadenopathy or masses. Calcified
subcarinal lymph node is consistent with prior granulomatous
disease. Visualized lungs show no evidence of pulmonary edema,
consolidation, pneumothorax, nodule or pleural fluid. Visualized
upper abdomen and bony structures are unremarkable.
IMPRESSION: 1. Coronary calcium score of 1.5 is at the 28th percentile for the
patient's age, sex and race.
2. Evidence of prior granulomatous disease with calcified subcarinal
lymph node.

## 2021-08-05 DIAGNOSIS — M62838 Other muscle spasm: Secondary | ICD-10-CM | POA: Diagnosis not present

## 2021-08-05 DIAGNOSIS — N393 Stress incontinence (female) (male): Secondary | ICD-10-CM | POA: Diagnosis not present

## 2021-08-05 DIAGNOSIS — M6281 Muscle weakness (generalized): Secondary | ICD-10-CM | POA: Diagnosis not present

## 2021-09-02 ENCOUNTER — Ambulatory Visit: Payer: Medicare PPO | Admitting: Infectious Disease

## 2021-09-02 ENCOUNTER — Encounter: Payer: Self-pay | Admitting: Infectious Disease

## 2021-09-02 ENCOUNTER — Other Ambulatory Visit: Payer: Self-pay

## 2021-09-02 VITALS — BP 149/83 | HR 49 | Temp 96.7°F | Wt 137.6 lb

## 2021-09-02 DIAGNOSIS — E785 Hyperlipidemia, unspecified: Secondary | ICD-10-CM | POA: Diagnosis not present

## 2021-09-02 DIAGNOSIS — I10 Essential (primary) hypertension: Secondary | ICD-10-CM

## 2021-09-02 DIAGNOSIS — Z227 Latent tuberculosis: Secondary | ICD-10-CM | POA: Diagnosis not present

## 2021-09-02 LAB — COMPLETE METABOLIC PANEL WITH GFR
AG Ratio: 1.5 (calc) (ref 1.0–2.5)
ALT: 20 U/L (ref 9–46)
AST: 22 U/L (ref 10–35)
Albumin: 4.1 g/dL (ref 3.6–5.1)
Alkaline phosphatase (APISO): 81 U/L (ref 35–144)
BUN: 14 mg/dL (ref 7–25)
CO2: 31 mmol/L (ref 20–32)
Calcium: 9.3 mg/dL (ref 8.6–10.3)
Chloride: 104 mmol/L (ref 98–110)
Creat: 0.97 mg/dL (ref 0.70–1.28)
Globulin: 2.8 g/dL (calc) (ref 1.9–3.7)
Glucose, Bld: 84 mg/dL (ref 65–99)
Potassium: 4.5 mmol/L (ref 3.5–5.3)
Sodium: 141 mmol/L (ref 135–146)
Total Bilirubin: 0.4 mg/dL (ref 0.2–1.2)
Total Protein: 6.9 g/dL (ref 6.1–8.1)
eGFR: 82 mL/min/{1.73_m2} (ref >=60)

## 2021-09-02 NOTE — Progress Notes (Signed)
Subjective:  Chief complaint follow-up for latent tuberculosis  Patient ID: Kevin Gamble, male    DOB: Oct 21, 1945, 75 y.o.   MRN: [id number]  HPI  Kevin Gamble is a very pleasant 75 year old [place] born man who states his actual age is likely more like 34 (his age was arbitrarily decided when he left [place]), who does have comorbid hypertension hyperlipidemia and recent prostate cancer status post surgery with apparent cure of his prostate cancer.  He had a chest x-ray which had shown a calcified granuloma.  This prompted his primary care physician to check a QuantiFERON gold which was positive.  He himself does not recall being exposed to a specific person who had tuberculosis.  He does remember his mother suffering from whooping cough when he was a small child.  He does not have any cough he has no fevers chills involuntary weight loss or systemic symptoms to suggest infection.  He did lose some weight onset of his prostate cancer but he has gained it back.  Is QuantiFERON gold was positive again when we saw him in the clinic.  His HIV test was negative hepatitis B and hepatitis C serologies were negative.  We initiated him on rifampin and isoniazid daily and he had gotten through a month of therapy without any difficulties.  He did notice that his urine is turned orange and his stool also has more of an orange color.  He is not experiencing nausea or abdominal discomfort.   He returns to clinic for follow-up today.  In talking to him it appears that he was without one of his medications for at least 10 days and was not on dual therapy I am trying to ascertain how many consecutive months of isoniazid and rifampin together he is taken.  He says he is currently just taking 1 medication and not the other 2 latent tuberculosis:  Through contact the pharmacy we were able to ascertain that he was on 2 medications starting on August 19 and based on his verbal history of when he finished  medications he would have finished 12 weeks of treatment at this point.  Past Medical History:  Diagnosis Date   BPH (benign prostatic hyperplasia) 2015   With increased urinary frequency --> Dr. Pilar Jarvis (Urology) - elevated PSA - Bx Neg    Chronic kidney disease    stage 3 kidney disease    Essential hypertension    Glaucoma    Dr. Katy Fitch   Hyperlipidemia due to dietary fat intake    Managed with diet control   Internal hemorrhoids    Need for hepatitis B screening test 05/28/2021   Need for hepatitis C screening test 05/28/2021   Positive QuantiFERON-TB Gold test 05/28/2021   Prostate cancer ([place])    TB lung, latent 07/01/2021    Past Surgical History:  Procedure Laterality Date   COLONOSCOPY  2019   EYE SURGERY Bilateral 2010   LYMPHADENECTOMY Bilateral 01/15/2021   Procedure: LYMPHADENECTOMY;  Surgeon: Alexis Frock, MD;  Location: [facility];  Service: Urology;  Laterality: Bilateral;   PROSTATE BIOPSY  2015, 2016   Dr. Vance Gather   ROBOT ASSISTED LAPAROSCOPIC RADICAL PROSTATECTOMY N/A 01/15/2021   Procedure: XI ROBOTIC ASSISTED LAPAROSCOPIC RADICAL PROSTATECTOMY WITH INDOCYANINE GREEN DYE INJECTION;  Surgeon: Alexis Frock, MD;  Location: [facility];  Service: Urology;  Laterality: N/A;  3 HRS    Family History  Problem Relation Age of Onset   Other Mother        Unknonwn cause/history -  died when pt was young   Other Father        Unknonwn cause/history - died when pt was young   Healthy Sister    Hypertension Brother    Colon cancer Neg Hx    Esophageal cancer Neg Hx    Rectal cancer Neg Hx    Stomach cancer Neg Hx       Social History   Socioeconomic History   Marital status: Married    Spouse name: Not on file   Number of children: 4   Years of education: Not on file   Highest education level: Not on file  Occupational History   Occupation: professor    Employer: [employer]    Comment: retired  Tobacco Use   Smoking status: Never   Smokeless tobacco: Never   Scientific laboratory technician Use: Never used  Substance and Sexual Activity   Alcohol use: Never   Drug use: Never   Sexual activity: Not Currently  Other Topics Concern   Not on file  Social History Narrative   Is a married father of 4 sons.    His listed birthday is in [birth year], but he thinks that actually his birthday was probably in [birth year] instead.   He usually walks every day about 2 to 1/8miles at a relatively brisk pace.   Social Determinants of Health   Financial Resource Strain: Not on file  Food Insecurity: Not on file  Transportation Needs: Not on file  Physical Activity: Not on file  Stress: Not on file  Social Connections: Not on file    No Known Allergies   Current Outpatient Medications:    isoniazid (NYDRAZID) 300 MG tablet, Take 1 tablet (300 mg total) by mouth daily., Disp: 30 tablet, Rfl: 2   losartan (COZAAR) 25 MG tablet, Take 25 mg by mouth daily., Disp: , Rfl:    Multiple Vitamins-Minerals (MULTI COMPLETE PO), Take by mouth., Disp: , Rfl:    pyridOXINE (VITAMIN B-6) 50 MG tablet, Take 1 tablet (50 mg total) by mouth daily., Disp: 30 tablet, Rfl: 2   rifampin (RIFADIN) 300 MG capsule, Take 2 capsules (600 mg total) by mouth daily., Disp: 60 capsule, Rfl: 2   Review of Systems  Constitutional:  Negative for activity change, appetite change, chills, diaphoresis, fatigue, fever and unexpected weight change.  HENT:  Negative for congestion, rhinorrhea, sinus pressure, sneezing, sore throat and trouble swallowing.   Eyes:  Negative for photophobia and visual disturbance.  Respiratory:  Negative for cough, chest tightness, shortness of breath, wheezing and stridor.   Cardiovascular:  Negative for chest pain, palpitations and leg swelling.  Gastrointestinal:  Negative for abdominal distention, abdominal pain, anal bleeding, blood in stool, constipation, diarrhea, nausea and vomiting.  Genitourinary:  Negative for difficulty urinating, dysuria, flank pain and hematuria.   Musculoskeletal:  Negative for arthralgias, back pain, gait problem, joint swelling and myalgias.  Skin:  Negative for color change, pallor, rash and wound.  Neurological:  Negative for dizziness, tremors, weakness and light-headedness.  Hematological:  Negative for adenopathy. Does not bruise/bleed easily.  Psychiatric/Behavioral:  Negative for agitation, behavioral problems, confusion, decreased concentration, dysphoric mood and sleep disturbance.       Objective:   Physical Exam Constitutional:      Appearance: He is well-developed.  HENT:     Head: Normocephalic and atraumatic.  Eyes:     Conjunctiva/sclera: Conjunctivae normal.  Cardiovascular:     Rate and Rhythm: Normal rate and regular rhythm.  Pulmonary:     Effort: Pulmonary effort is normal. No respiratory distress.     Breath sounds: No wheezing.  Abdominal:     General: There is no distension.     Palpations: Abdomen is soft.  Musculoskeletal:        General: No tenderness. Normal range of motion.     Cervical back: Normal range of motion and neck supple.  Skin:    General: Skin is warm and dry.     Coloration: Skin is not pale.     Findings: No erythema or rash.  Neurological:     General: No focal deficit present.     Mental Status: He is alert and oriented to person, place, and time.  Psychiatric:        Mood and Affect: Mood normal.        Behavior: Behavior normal.        Thought Content: Thought content normal.        Judgment: Judgment normal.          Assessment & Plan:   Latent tuberculosis: Has completed 12 weeks of therapy.  In the future I am not going to send TB medications to the pharmacy so than [facility] because I began to have a great deal of distrust about their clinical ability to understand that when we are treating TB we need to be giving multiple drugs not monotherapy unless it is with isoniazid for 9 months.  I will recheck a CMP and CBC with differential  Hyperlipidemia on a  statin Hypertension: Not optimal at this point in time in clinic  Vitals:   09/02/21 1033  BP: (!) 149/83  Pulse: (!) 49  Temp: (!) 96.7 F (35.9 C)

## 2021-11-10 DIAGNOSIS — E7800 Pure hypercholesterolemia, unspecified: Secondary | ICD-10-CM | POA: Diagnosis not present

## 2021-11-10 DIAGNOSIS — C61 Malignant neoplasm of prostate: Secondary | ICD-10-CM | POA: Diagnosis not present

## 2021-11-13 DIAGNOSIS — Z8611 Personal history of tuberculosis: Secondary | ICD-10-CM | POA: Diagnosis not present

## 2021-11-13 DIAGNOSIS — Z8546 Personal history of malignant neoplasm of prostate: Secondary | ICD-10-CM | POA: Diagnosis not present

## 2021-11-13 DIAGNOSIS — J310 Chronic rhinitis: Secondary | ICD-10-CM | POA: Diagnosis not present

## 2021-11-13 DIAGNOSIS — I10 Essential (primary) hypertension: Secondary | ICD-10-CM | POA: Diagnosis not present

## 2021-11-17 DIAGNOSIS — N5201 Erectile dysfunction due to arterial insufficiency: Secondary | ICD-10-CM | POA: Diagnosis not present

## 2021-11-17 DIAGNOSIS — N393 Stress incontinence (female) (male): Secondary | ICD-10-CM | POA: Diagnosis not present

## 2021-11-17 DIAGNOSIS — C61 Malignant neoplasm of prostate: Secondary | ICD-10-CM | POA: Diagnosis not present

## 2021-12-11 DIAGNOSIS — I10 Essential (primary) hypertension: Secondary | ICD-10-CM | POA: Diagnosis not present

## 2021-12-11 DIAGNOSIS — R32 Unspecified urinary incontinence: Secondary | ICD-10-CM | POA: Diagnosis not present

## 2022-03-30 DIAGNOSIS — Z125 Encounter for screening for malignant neoplasm of prostate: Secondary | ICD-10-CM | POA: Diagnosis not present

## 2022-03-30 DIAGNOSIS — Z7982 Long term (current) use of aspirin: Secondary | ICD-10-CM | POA: Diagnosis not present

## 2022-03-30 DIAGNOSIS — I10 Essential (primary) hypertension: Secondary | ICD-10-CM | POA: Diagnosis not present

## 2022-03-30 DIAGNOSIS — E7800 Pure hypercholesterolemia, unspecified: Secondary | ICD-10-CM | POA: Diagnosis not present

## 2022-04-03 DIAGNOSIS — Z8611 Personal history of tuberculosis: Secondary | ICD-10-CM | POA: Diagnosis not present

## 2022-04-03 DIAGNOSIS — I10 Essential (primary) hypertension: Secondary | ICD-10-CM | POA: Diagnosis not present

## 2022-04-03 DIAGNOSIS — Z0000 Encounter for general adult medical examination without abnormal findings: Secondary | ICD-10-CM | POA: Diagnosis not present

## 2022-04-03 DIAGNOSIS — Z23 Encounter for immunization: Secondary | ICD-10-CM | POA: Diagnosis not present

## 2022-04-03 DIAGNOSIS — J310 Chronic rhinitis: Secondary | ICD-10-CM | POA: Diagnosis not present

## 2022-04-03 DIAGNOSIS — I452 Bifascicular block: Secondary | ICD-10-CM | POA: Diagnosis not present

## 2022-04-03 DIAGNOSIS — E875 Hyperkalemia: Secondary | ICD-10-CM | POA: Diagnosis not present

## 2022-04-03 DIAGNOSIS — J3089 Other allergic rhinitis: Secondary | ICD-10-CM | POA: Diagnosis not present

## 2022-04-03 DIAGNOSIS — C61 Malignant neoplasm of prostate: Secondary | ICD-10-CM | POA: Diagnosis not present

## 2022-05-11 DIAGNOSIS — C61 Malignant neoplasm of prostate: Secondary | ICD-10-CM | POA: Diagnosis not present

## 2022-06-02 DIAGNOSIS — N393 Stress incontinence (female) (male): Secondary | ICD-10-CM | POA: Diagnosis not present

## 2022-06-02 DIAGNOSIS — C61 Malignant neoplasm of prostate: Secondary | ICD-10-CM | POA: Diagnosis not present

## 2022-06-03 ENCOUNTER — Other Ambulatory Visit (HOSPITAL_COMMUNITY): Payer: Self-pay | Admitting: Urology

## 2022-06-03 DIAGNOSIS — R9721 Rising PSA following treatment for malignant neoplasm of prostate: Secondary | ICD-10-CM

## 2022-06-10 ENCOUNTER — Other Ambulatory Visit (HOSPITAL_COMMUNITY): Payer: Medicare PPO

## 2022-06-19 ENCOUNTER — Ambulatory Visit (HOSPITAL_COMMUNITY)
Admission: RE | Admit: 2022-06-19 | Discharge: 2022-06-19 | Disposition: A | Discharge status: Home / Self Care | Payer: Medicare PPO | Source: Ambulatory Visit | Attending: Urology | Admitting: Urology

## 2022-06-19 DIAGNOSIS — R9721 Rising PSA following treatment for malignant neoplasm of prostate: Secondary | ICD-10-CM | POA: Diagnosis not present

## 2022-06-19 DIAGNOSIS — C61 Malignant neoplasm of prostate: Secondary | ICD-10-CM | POA: Diagnosis not present

## 2022-06-19 MED ORDER — PIFLIFOLASTAT F 18 (PYLARIFY) INJECTION
9.0000 | Freq: Once | INTRAVENOUS | Status: AC
Start: 2022-06-19 — End: 2022-06-19
  Administered 2022-06-19: 9 via INTRAVENOUS

## 2022-07-21 DIAGNOSIS — C61 Malignant neoplasm of prostate: Secondary | ICD-10-CM | POA: Diagnosis not present

## 2022-07-21 DIAGNOSIS — N393 Stress incontinence (female) (male): Secondary | ICD-10-CM | POA: Diagnosis not present

## 2022-07-21 DIAGNOSIS — N5201 Erectile dysfunction due to arterial insufficiency: Secondary | ICD-10-CM | POA: Diagnosis not present

## 2023-01-26 DIAGNOSIS — C61 Malignant neoplasm of prostate: Secondary | ICD-10-CM | POA: Diagnosis not present

## 2023-02-02 DIAGNOSIS — C61 Malignant neoplasm of prostate: Secondary | ICD-10-CM | POA: Diagnosis not present

## 2023-02-02 DIAGNOSIS — N393 Stress incontinence (female) (male): Secondary | ICD-10-CM | POA: Diagnosis not present

## 2023-02-02 DIAGNOSIS — N5201 Erectile dysfunction due to arterial insufficiency: Secondary | ICD-10-CM | POA: Diagnosis not present

## 2023-02-17 DIAGNOSIS — C61 Malignant neoplasm of prostate: Secondary | ICD-10-CM | POA: Diagnosis not present

## 2023-02-17 DIAGNOSIS — Z5111 Encounter for antineoplastic chemotherapy: Secondary | ICD-10-CM | POA: Diagnosis not present

## 2023-02-25 DIAGNOSIS — N1831 Chronic kidney disease, stage 3a: Secondary | ICD-10-CM | POA: Diagnosis not present

## 2023-02-25 DIAGNOSIS — Z961 Presence of intraocular lens: Secondary | ICD-10-CM | POA: Diagnosis not present

## 2023-02-25 DIAGNOSIS — I129 Hypertensive chronic kidney disease with stage 1 through stage 4 chronic kidney disease, or unspecified chronic kidney disease: Secondary | ICD-10-CM | POA: Diagnosis not present

## 2023-02-25 DIAGNOSIS — C61 Malignant neoplasm of prostate: Secondary | ICD-10-CM | POA: Diagnosis not present

## 2023-02-25 DIAGNOSIS — N529 Male erectile dysfunction, unspecified: Secondary | ICD-10-CM | POA: Diagnosis not present

## 2023-02-25 DIAGNOSIS — M1990 Unspecified osteoarthritis, unspecified site: Secondary | ICD-10-CM | POA: Diagnosis not present

## 2023-02-25 DIAGNOSIS — E785 Hyperlipidemia, unspecified: Secondary | ICD-10-CM | POA: Diagnosis not present

## 2023-04-06 DIAGNOSIS — Z125 Encounter for screening for malignant neoplasm of prostate: Secondary | ICD-10-CM | POA: Diagnosis not present

## 2023-04-06 DIAGNOSIS — I10 Essential (primary) hypertension: Secondary | ICD-10-CM | POA: Diagnosis not present

## 2023-04-09 DIAGNOSIS — I10 Essential (primary) hypertension: Secondary | ICD-10-CM | POA: Diagnosis not present

## 2023-04-09 DIAGNOSIS — Z0000 Encounter for general adult medical examination without abnormal findings: Secondary | ICD-10-CM | POA: Diagnosis not present

## 2023-04-09 DIAGNOSIS — N393 Stress incontinence (female) (male): Secondary | ICD-10-CM | POA: Diagnosis not present

## 2023-04-09 DIAGNOSIS — C61 Malignant neoplasm of prostate: Secondary | ICD-10-CM | POA: Diagnosis not present

## 2023-04-09 DIAGNOSIS — Z1159 Encounter for screening for other viral diseases: Secondary | ICD-10-CM | POA: Diagnosis not present

## 2023-04-09 DIAGNOSIS — Z8611 Personal history of tuberculosis: Secondary | ICD-10-CM | POA: Diagnosis not present

## 2023-04-20 ENCOUNTER — Encounter: Payer: Self-pay | Admitting: Gastroenterology

## 2023-05-03 ENCOUNTER — Encounter: Payer: Self-pay | Admitting: Gastroenterology

## 2023-05-06 DIAGNOSIS — C61 Malignant neoplasm of prostate: Secondary | ICD-10-CM | POA: Diagnosis not present

## 2023-05-13 DIAGNOSIS — N5201 Erectile dysfunction due to arterial insufficiency: Secondary | ICD-10-CM | POA: Diagnosis not present

## 2023-05-13 DIAGNOSIS — C61 Malignant neoplasm of prostate: Secondary | ICD-10-CM | POA: Diagnosis not present

## 2023-05-13 DIAGNOSIS — N393 Stress incontinence (female) (male): Secondary | ICD-10-CM | POA: Diagnosis not present

## 2023-05-14 ENCOUNTER — Ambulatory Visit (AMBULATORY_SURGERY_CENTER): Payer: Medicare PPO

## 2023-05-14 ENCOUNTER — Encounter: Payer: Self-pay | Admitting: Gastroenterology

## 2023-05-14 VITALS — Ht 68.0 in | Wt 130.0 lb

## 2023-05-14 DIAGNOSIS — Z86010 Personal history of colonic polyps: Secondary | ICD-10-CM

## 2023-05-14 MED ORDER — NA SULFATE-K SULFATE-MG SULF 17.5-3.13-1.6 GM/177ML PO SOLN: 1.0000 | Freq: Once | ORAL | 0 refills | Status: AC

## 2023-05-14 NOTE — Progress Notes (Signed)

## 2023-05-25 DIAGNOSIS — C61 Malignant neoplasm of prostate: Secondary | ICD-10-CM | POA: Diagnosis not present

## 2023-05-30 ENCOUNTER — Encounter: Payer: Self-pay | Admitting: Certified Registered Nurse Anesthetist

## 2023-06-02 ENCOUNTER — Encounter: Payer: Self-pay | Admitting: Gastroenterology

## 2023-06-02 ENCOUNTER — Ambulatory Visit (AMBULATORY_SURGERY_CENTER): Payer: Medicare PPO | Admitting: Gastroenterology

## 2023-06-02 VITALS — BP 163/68 | HR 51 | Temp 96.6°F | Resp 12 | Ht 68.0 in | Wt 130.0 lb

## 2023-06-02 DIAGNOSIS — D122 Benign neoplasm of ascending colon: Secondary | ICD-10-CM

## 2023-06-02 DIAGNOSIS — D123 Benign neoplasm of transverse colon: Secondary | ICD-10-CM | POA: Diagnosis not present

## 2023-06-02 DIAGNOSIS — I10 Essential (primary) hypertension: Secondary | ICD-10-CM | POA: Diagnosis not present

## 2023-06-02 DIAGNOSIS — N183 Chronic kidney disease, stage 3 unspecified: Secondary | ICD-10-CM | POA: Diagnosis not present

## 2023-06-02 DIAGNOSIS — Z86010 Personal history of colonic polyps: Secondary | ICD-10-CM

## 2023-06-02 DIAGNOSIS — Z1211 Encounter for screening for malignant neoplasm of colon: Secondary | ICD-10-CM | POA: Diagnosis not present

## 2023-06-02 DIAGNOSIS — K635 Polyp of colon: Secondary | ICD-10-CM | POA: Diagnosis not present

## 2023-06-02 DIAGNOSIS — Z09 Encounter for follow-up examination after completed treatment for conditions other than malignant neoplasm: Secondary | ICD-10-CM

## 2023-06-02 MED ORDER — SODIUM CHLORIDE 0.9 % IV SOLN: 500.0000 mL | Freq: Once | INTRAVENOUS | Status: DC

## 2023-06-02 NOTE — Op Note (Signed)
[facility] Patient Name: Kevin Gamble Procedure Date: 06/02/2023 10:30 AM MRN: [id number] Endoscopist: Viviann Spare P. Adela Lank , MD, [id number] Age: 77 Referring MD:  Date of Birth: [birth year] Gender: Male Account #: [id number] Procedure:                Colonoscopy Indications:              High risk colon cancer surveillance: Personal                            history of colonic polyps - 3 adenomas removed                            04/2020 Medicines:                Monitored Anesthesia Care Procedure:                Pre-Anesthesia Assessment:                           - Prior to the procedure, a History and Physical                            was performed, and patient medications and                            allergies were reviewed. The patient's tolerance of                            previous anesthesia was also reviewed. The risks                            and benefits of the procedure and the sedation                            options and risks were discussed with the patient.                            All questions were answered, and informed consent                            was obtained. Prior Anticoagulants: The patient has                            taken no anticoagulant or antiplatelet agents. ASA                            Grade Assessment: II - A patient with mild systemic                            disease. After reviewing the risks and benefits,                            the patient was deemed in satisfactory condition to  undergo the procedure.                           After obtaining informed consent, the colonoscope                            was passed under direct vision. Throughout the                            procedure, the patient's blood pressure, pulse, and                            oxygen saturations were monitored continuously. The                            [id number]-HQ190L Colonoscope [id number] was introduced                             through the anus and advanced to the the cecum,                            identified by appendiceal orifice and ileocecal                            valve. The colonoscopy was performed without                            difficulty. The patient tolerated the procedure                            well. The quality of the bowel preparation was                            adequate. The ileocecal valve, appendiceal orifice,                            and rectum were photographed. Scope In: 10:50:08 AM Scope Out: 11:12:28 AM Scope Withdrawal Time: 0 hours 18 minutes 26 seconds  Total Procedure Duration: 0 hours 22 minutes 20 seconds  Findings:                 The perianal and digital rectal examinations were                            normal.                           Multiple small-mouthed diverticula were found in                            the transverse colon and left colon.                           The colon was tortuous and spastic.  A diminutive polyp was found in the ascending                            colon. The polyp was sessile. The polyp was removed                            with a cold snare. Resection and retrieval were                            complete.                           A diminutive polyp was found in the transverse                            colon. The polyp was flat. The polyp was removed                            with a cold snare. Resection and retrieval were                            complete.                           Internal hemorrhoids were found during retroflexion.                           The exam was otherwise without abnormality. Complications:            No immediate complications. Estimated blood loss:                            Minimal. Estimated Blood Loss:     Estimated blood loss was minimal. Impression:               - Diverticulosis in the transverse colon and in the                             left colon.                           - Tortuous / spastic colon.                           - One diminutive polyp in the ascending colon,                            removed with a cold snare. Resected and retrieved.                           - One diminutive polyp in the transverse colon,                            removed with a cold snare. Resected and retrieved.                           -  Internal hemorrhoids.                           - The examination was otherwise normal. Recommendation:           - Patient has a contact number available for                            emergencies. The signs and symptoms of potential                            delayed complications were discussed with the                            patient. Return to normal activities tomorrow.                            Written discharge instructions were provided to the                            patient.                           - Resume previous diet.                           - Continue present medications.                           - Await pathology results.                           - Given no high risk polyps on this exam and the                            patient's age, likely no further colonoscopy                            surveillance exams are warranted. Viviann Spare P. , MD 06/02/2023 11:17:52 AM This report has been signed electronically.

## 2023-06-02 NOTE — Progress Notes (Signed)
[facility] History and Physical   Primary Care Physician:  Kevin Brunette, MD   Reason for Procedure:   History of colon polyps  Plan:    colonoscopy     HPI: Kevin Gamble is a 77 y.o. male  here for colonoscopy surveillance - 3 adenomas removed 04/2020.   Patient denies any bowel symptoms at this time. No family history of colon cancer known. Otherwise feels well without any cardiopulmonary symptoms.   I have discussed risks / benefits of anesthesia and endoscopic procedure with Kevin Gamble and they wish to proceed with the exams as outlined today.    Past Medical History:  Diagnosis Date   BPH (benign prostatic hyperplasia) 2015   With increased urinary frequency --> Dr. Sherryl Gamble (Urology) - elevated PSA - Bx Neg    Chronic kidney disease    stage 3 kidney disease    Essential hypertension    Glaucoma    Dr. Dione Gamble   Hyperlipidemia due to dietary fat intake    Managed with diet control   Internal hemorrhoids    Need for hepatitis B screening test 05/28/2021   Need for hepatitis C screening test 05/28/2021   Positive QuantiFERON-TB Gold test 05/28/2021   Prostate cancer ([facility])    TB lung, latent 07/01/2021    Past Surgical History:  Procedure Laterality Date   COLONOSCOPY  2019   EYE SURGERY Bilateral 2010   LYMPHADENECTOMY Bilateral 01/15/2021   Procedure: LYMPHADENECTOMY;  Surgeon: Kevin Ache, MD;  Location: [facility];  Service: Urology;  Laterality: Bilateral;   PROSTATE BIOPSY  2015, 2016   Dr. Matt Gamble   ROBOT ASSISTED LAPAROSCOPIC RADICAL PROSTATECTOMY N/A 01/15/2021   Procedure: XI ROBOTIC ASSISTED LAPAROSCOPIC RADICAL PROSTATECTOMY WITH INDOCYANINE GREEN DYE INJECTION;  Surgeon: Kevin Ache, MD;  Location: [facility];  Service: Urology;  Laterality: N/A;  3 HRS    Prior to Admission medications   Medication Sig Start Date End Date Taking? Authorizing Provider  losartan (COZAAR) 25 MG tablet Take 25 mg by mouth daily. 03/15/19  Yes [provider]  Multiple Vitamins-Minerals (MULTI COMPLETE PO) Take by mouth.   Yes [provider]    Current Outpatient Medications  Medication Sig Dispense Refill   losartan (COZAAR) 25 MG tablet Take 25 mg by mouth daily.     Multiple Vitamins-Minerals (MULTI COMPLETE PO) Take by mouth.     Current Facility-Administered Medications  Medication Dose Route Frequency Provider Last Rate Last Admin   0.9 %  sodium chloride infusion  500 mL Intravenous Once , Kevin Rayas, MD        Allergies as of 06/02/2023   (No Known Allergies)    Family History  Problem Relation Age of Onset   Other Mother        Unknonwn cause/history - died when pt was young   Other Father        Unknonwn cause/history - died when pt was young   Healthy Sister    Hypertension Brother    Colon cancer Neg Hx    Esophageal cancer Neg Hx    Rectal cancer Neg Hx    Stomach cancer Neg Hx     Social History   Socioeconomic History   Marital status: Married    Spouse name: Not on file   Number of children: 4   Years of education: Not on file   Highest education level: Not on file  Occupational History   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    His listed birthday is in [birth year], but he thinks that actually his birthday was probably in [birth year] instead.   He usually walks every day about 2 to 1/13miles at a relatively brisk pace.   Social Determinants of Health   Financial Resource Strain: Not on file  Food Insecurity: Not on file  Transportation Needs: Not on file  Physical Activity: Not on file  Stress: Not on file  Social Connections: Not on file   Intimate Partner Violence: Not on file    Review of Systems: All other review of systems negative except as mentioned in the HPI.  Physical Exam: Vital signs BP (!) 150/78   Pulse 60   Temp (!) 96.6 F (35.9 C)   Ht 5\' 8"  (1.727 m)   Wt 130 lb (59 kg)   SpO2 97%   BMI 19.77 kg/m   General:   Alert,  Well-developed, pleasant and cooperative in NAD Lungs:  Clear throughout to auscultation.   Heart:  Regular rate and rhythm Abdomen:  Soft, nontender and nondistended.   Neuro/Psych:  Alert and cooperative. Normal mood and affect. A and O x 3  Kevin Rain, MD [facility]

## 2023-06-02 NOTE — Patient Instructions (Addendum)
Recommendation:- Patient has a contact number available for                            emergencies. The signs and symptoms of potential                            delayed complications were discussed with the                            patient. Return to normal activities tomorrow.                            Written discharge instructions were provided to the                            patient.                           - Resume previous diet.                           - Continue present medications.                           - Await pathology results.                           - Given no high risk polyps on this exam and the                            patient's age, likely no further colonoscopy                            surveillance exams are warranted.  Handouts on diverticulosis, polyps and hemorrhoids given.  YOU HAD AN ENDOSCOPIC PROCEDURE TODAY AT THE [facility]:   Refer to the procedure report that was given to you for any specific questions about what was found during the examination.  If the procedure report does not answer your questions, please call your gastroenterologist to clarify.  If you requested that your care partner not be given the details of your procedure findings, then the procedure report has been included in a sealed envelope for you to review at your convenience later.  YOU SHOULD EXPECT: Some feelings of bloating in the abdomen. Passage of more gas than usual.  Walking can help get rid of the air that was put into your GI tract during the procedure and reduce the bloating. If you had a lower endoscopy (such as a colonoscopy or flexible sigmoidoscopy) you may notice spotting of blood in your stool or on the toilet paper. If you underwent a bowel prep for your procedure, you may not have a normal bowel movement for a few days.  Please Note:  You might notice some irritation and congestion in your nose or some drainage.  This is from the oxygen used during  your procedure.  There is no need for concern and it should clear up in a day or so.  SYMPTOMS TO REPORT IMMEDIATELY:  Following lower endoscopy (colonoscopy or  flexible sigmoidoscopy):  Excessive amounts of blood in the stool  Significant tenderness or worsening of abdominal pains  Swelling of the abdomen that is new, acute  Fever of 100F or higher  For urgent or emergent issues, a gastroenterologist can be reached at any hour by calling [phone]. Do not use MyChart messaging for urgent concerns.    DIET:  We do recommend a small meal at first, but then you may proceed to your regular diet.  Drink plenty of fluids but you should avoid alcoholic beverages for 24 hours.  ACTIVITY:  You should plan to take it easy for the rest of today and you should NOT DRIVE or use heavy machinery until tomorrow (because of the sedation medicines used during the test).    FOLLOW UP: Our staff will call the number listed on your records the next business day following your procedure.  We will call around 7:15- 8:00 am to check on you and address any questions or concerns that you may have regarding the information given to you following your procedure. If we do not reach you, we will leave a message.     If any biopsies were taken you will be contacted by phone or by letter within the next 1-3 weeks.  Please call us at [phone] if you have not heard about the biopsies in 3 weeks.    SIGNATURES/CONFIDENTIALITY: You and/or your care partner have signed paperwork which will be entered into your electronic medical record.  These signatures attest to the fact that that the information above on your After Visit Summary has been reviewed and is understood.  Full responsibility of the confidentiality of this discharge information lies with you and/or your care-partner.

## 2023-06-02 NOTE — Progress Notes (Signed)
Report given to PACU, vss 

## 2023-06-02 NOTE — Progress Notes (Signed)
Called to room to assist during endoscopic procedure.  Patient ID and intended procedure confirmed with present staff. Received instructions for my participation in the procedure from the performing physician.  

## 2023-06-02 NOTE — Progress Notes (Signed)
Pt's states no medical or surgical changes since previsit or office visit. 

## 2023-06-03 ENCOUNTER — Telehealth: Payer: Self-pay | Admitting: *Deleted

## 2023-06-03 NOTE — Telephone Encounter (Signed)
  Follow up Call-     06/02/2023   10:15 AM  Call back number  Post procedure Call Back phone  # [phone]  Permission to leave phone message Yes     Patient questions:  Message left to call us if necessary.

## 2023-06-14 ENCOUNTER — Encounter: Payer: Self-pay | Admitting: Gastroenterology

## 2023-09-23 DIAGNOSIS — C61 Malignant neoplasm of prostate: Secondary | ICD-10-CM | POA: Diagnosis not present

## 2023-09-30 DIAGNOSIS — C61 Malignant neoplasm of prostate: Secondary | ICD-10-CM | POA: Diagnosis not present

## 2023-09-30 DIAGNOSIS — N393 Stress incontinence (female) (male): Secondary | ICD-10-CM | POA: Diagnosis not present

## 2023-09-30 DIAGNOSIS — N5201 Erectile dysfunction due to arterial insufficiency: Secondary | ICD-10-CM | POA: Diagnosis not present

## 2023-12-30 DIAGNOSIS — C61 Malignant neoplasm of prostate: Secondary | ICD-10-CM | POA: Diagnosis not present

## 2024-01-06 DIAGNOSIS — N393 Stress incontinence (female) (male): Secondary | ICD-10-CM | POA: Diagnosis not present

## 2024-01-06 DIAGNOSIS — N5201 Erectile dysfunction due to arterial insufficiency: Secondary | ICD-10-CM | POA: Diagnosis not present

## 2024-01-06 DIAGNOSIS — C61 Malignant neoplasm of prostate: Secondary | ICD-10-CM | POA: Diagnosis not present

## 2024-04-17 DIAGNOSIS — Z125 Encounter for screening for malignant neoplasm of prostate: Secondary | ICD-10-CM | POA: Diagnosis not present

## 2024-04-17 DIAGNOSIS — I10 Essential (primary) hypertension: Secondary | ICD-10-CM | POA: Diagnosis not present

## 2024-04-20 DIAGNOSIS — I452 Bifascicular block: Secondary | ICD-10-CM | POA: Diagnosis not present

## 2024-04-20 DIAGNOSIS — Z0000 Encounter for general adult medical examination without abnormal findings: Secondary | ICD-10-CM | POA: Diagnosis not present

## 2024-04-20 DIAGNOSIS — J3089 Other allergic rhinitis: Secondary | ICD-10-CM | POA: Diagnosis not present

## 2024-04-20 DIAGNOSIS — C61 Malignant neoplasm of prostate: Secondary | ICD-10-CM | POA: Diagnosis not present

## 2024-04-20 DIAGNOSIS — Z8611 Personal history of tuberculosis: Secondary | ICD-10-CM | POA: Diagnosis not present

## 2024-04-20 DIAGNOSIS — I10 Essential (primary) hypertension: Secondary | ICD-10-CM | POA: Diagnosis not present

## 2024-04-20 DIAGNOSIS — M792 Neuralgia and neuritis, unspecified: Secondary | ICD-10-CM | POA: Diagnosis not present

## 2024-04-20 DIAGNOSIS — N401 Enlarged prostate with lower urinary tract symptoms: Secondary | ICD-10-CM | POA: Diagnosis not present

## 2024-05-19 DIAGNOSIS — C61 Malignant neoplasm of prostate: Secondary | ICD-10-CM | POA: Diagnosis not present

## 2024-05-25 DIAGNOSIS — R140 Abdominal distension (gaseous): Secondary | ICD-10-CM | POA: Diagnosis not present

## 2024-05-25 DIAGNOSIS — N5201 Erectile dysfunction due to arterial insufficiency: Secondary | ICD-10-CM | POA: Diagnosis not present

## 2024-05-25 DIAGNOSIS — G629 Polyneuropathy, unspecified: Secondary | ICD-10-CM | POA: Diagnosis not present

## 2024-05-25 DIAGNOSIS — N393 Stress incontinence (female) (male): Secondary | ICD-10-CM | POA: Diagnosis not present

## 2024-05-25 DIAGNOSIS — C61 Malignant neoplasm of prostate: Secondary | ICD-10-CM | POA: Diagnosis not present

## 2024-05-25 DIAGNOSIS — R9721 Rising PSA following treatment for malignant neoplasm of prostate: Secondary | ICD-10-CM | POA: Diagnosis not present

## 2024-08-04 DIAGNOSIS — C61 Malignant neoplasm of prostate: Secondary | ICD-10-CM | POA: Diagnosis not present

## 2024-08-04 DIAGNOSIS — I129 Hypertensive chronic kidney disease with stage 1 through stage 4 chronic kidney disease, or unspecified chronic kidney disease: Secondary | ICD-10-CM | POA: Diagnosis not present

## 2024-08-04 DIAGNOSIS — N400 Enlarged prostate without lower urinary tract symptoms: Secondary | ICD-10-CM | POA: Diagnosis not present

## 2024-08-04 DIAGNOSIS — M1990 Unspecified osteoarthritis, unspecified site: Secondary | ICD-10-CM | POA: Diagnosis not present

## 2024-08-04 DIAGNOSIS — D8481 Immunodeficiency due to conditions classified elsewhere: Secondary | ICD-10-CM | POA: Diagnosis not present

## 2024-08-04 DIAGNOSIS — N1831 Chronic kidney disease, stage 3a: Secondary | ICD-10-CM | POA: Diagnosis not present

## 2024-08-04 DIAGNOSIS — J309 Allergic rhinitis, unspecified: Secondary | ICD-10-CM | POA: Diagnosis not present

## 2024-08-04 DIAGNOSIS — E785 Hyperlipidemia, unspecified: Secondary | ICD-10-CM | POA: Diagnosis not present

## 2024-08-04 DIAGNOSIS — R32 Unspecified urinary incontinence: Secondary | ICD-10-CM | POA: Diagnosis not present

## 2024-11-06 ENCOUNTER — Other Ambulatory Visit (HOSPITAL_COMMUNITY): Payer: Self-pay | Admitting: Urology

## 2024-11-06 DIAGNOSIS — C61 Malignant neoplasm of prostate: Secondary | ICD-10-CM

## 2024-11-30 ENCOUNTER — Ambulatory Visit (HOSPITAL_COMMUNITY)
